# Patient Record
Sex: Male | Born: 2000 | Race: White | Hispanic: No | Marital: Single | Smoking: Never smoker
Health system: Southern US, Community
[De-identification: ages and names within clinical notes are randomized; demographics above are authoritative.]

## PROBLEM LIST (undated history)

## (undated) DIAGNOSIS — R569 Unspecified convulsions: Secondary | ICD-10-CM

## (undated) DIAGNOSIS — F840 Autistic disorder: Secondary | ICD-10-CM

## (undated) HISTORY — DX: Unspecified convulsions: R56.9

## (undated) HISTORY — DX: Autistic disorder: F84.0

---
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
## 2019-01-01 ENCOUNTER — Encounter (INDEPENDENT_AMBULATORY_CARE_PROVIDER_SITE_OTHER): Payer: Self-pay | Admitting: Pediatrics

## 2019-03-01 ENCOUNTER — Ambulatory Visit (INDEPENDENT_AMBULATORY_CARE_PROVIDER_SITE_OTHER): Payer: BLUE CROSS/BLUE SHIELD | Admitting: Pediatrics

## 2019-03-06 ENCOUNTER — Ambulatory Visit (INDEPENDENT_AMBULATORY_CARE_PROVIDER_SITE_OTHER): Payer: BLUE CROSS/BLUE SHIELD | Admitting: Pediatrics

## 2019-03-06 ENCOUNTER — Other Ambulatory Visit: Payer: Self-pay

## 2019-03-06 ENCOUNTER — Encounter (INDEPENDENT_AMBULATORY_CARE_PROVIDER_SITE_OTHER): Payer: Self-pay | Admitting: Pediatrics

## 2019-03-06 DIAGNOSIS — F840 Autistic disorder: Secondary | ICD-10-CM

## 2019-03-06 DIAGNOSIS — G40309 Generalized idiopathic epilepsy and epileptic syndromes, not intractable, without status epilepticus: Secondary | ICD-10-CM

## 2019-03-06 DIAGNOSIS — G40A09 Absence epileptic syndrome, not intractable, without status epilepticus: Secondary | ICD-10-CM

## 2019-03-06 MED ORDER — VALPROATE SODIUM 250 MG/5ML PO SOLN: ORAL | 3 refills | Status: DC

## 2019-03-06 NOTE — Patient Instructions (Signed)
It was good to see you today.  I think that we had a good chat.  This too bad the Andre Holland was not in the mood to cooperate.  I think you are doing a great job.  I am pleased that is he gets older he seems to be able to self regulate more.  I am also pleased that he is not having any seizures.  I sent her prescription for a 90-day refill to [facility] hopefully it all works out.  Let me know if there is a problem.  We will see him in 6 months.

## 2019-03-06 NOTE — Progress Notes (Signed)
This is a Pediatric Specialist E-Visit follow up consult provided via WebEx Clovia Cuff and their parent/guardian Domonic Buchinger consented to an E-Visit consult today.  Location of patient: [place] is with mom Location of provider: Ellison Carwin, MD is in office Patient was referred by Loyal Jacobson, MD   The following participants were involved in this E-Visit: mom, CMA, provider  Chief Complain/ Reason for E-Visit today: Autism/Seizures Total time on call: 15 minutes Follow up: 6 months    Patient: Andre Holland MRN: [id number] Sex: male DOB: [birth year]  Provider: Ellison Carwin, MD Location of Care: [facility]  Note type: Routine return visit  History of Present Illness: Referral Source: Imogene Burn, MD History from: mother, patient and [facility] chart Chief Complaint: Autism/Seizures  ANDRES KAWCZYNSKI is a 18 y.o. male who returns on March 06, 2019 for the first time since August 29, 2018.  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  The patient's last known seizure was in 2016.  He takes and tolerates valproic acid.  He is followed by Dr. Jannifer Franklin who prescribes medications that are psychotropic to help his mood and behavior.  As he has gotten older, the patient has meltdowns that he is able to self regulate both at home and at school.  One of the signs that he is becoming frustrated is that he will repetitively smack his head.  If his attention can be diverted and he can be distracted from what is frustrating him, things seem to resolve fairly well.  He is in the 12th grade at [employer].  Though he is 55, he has another couple of years that he can attend.  In the recent past, he had a dental procedure that helped to restore his dental health.  In general, his health is good.   He sleeps well.  His appetite is large.  He has gained about 5 pounds since he was last seen which is fairly remarkable given that the antiepileptic medicines and neurotrophic medicines are all known to cause increased appetite.  Review of Systems: A complete review of systems was remarkable for mom reports that patient has not had any tonic clonic seizures. She states as far as his absence seizures, she would have to say he has not had any. She states that Dr. Mervyn Skeeters did not change any of the patient's medications due to how well he has been doing. No conerns reported at this time., all other systems reviewed and negative.  Past Medical History History reviewed. No pertinent past medical history. Hospitalizations: No., Head Injury: No., Nervous System Infections: No., Immunizations up to date: Yes.    Copied from prior chart EEG was performed on 09/15/2017 (in the setting of patient being on therapy) which showed no epileptiform activity.  Birth History 5lbs. 0oz. infant born at [redacted]weeks gestational age to a 19year old g 1p 10female. 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 Sees psychiatry and on SSRI and atarax for symptom control, Autism Spectrum Disorder, level  2  Surgical History History reviewed. No pertinent surgical history.  Family History family history is not on file. Family history is negative for migraines, seizures, intellectual disabilities, blindness,  deafness, birth defects, chromosomal disorder, or autism.  Social History  Socioeconomic History  . Marital status: Single  . Years of education:  13 years  . Highest education level:  High school certificate  Occupational History  . Not employed due to cognitive disability  Social Needs  . Financial resource strain: Not on file  . Food insecurity:    Worry: Not on file    Inability: Not on file  . Transportation needs:    Medical: Not on file    Non-medical: Not on file  Tobacco Use  . Smoking status: Never Smoker  . Smokeless tobacco: Never Used  Substance and Sexual Activity  . Alcohol use: Not on file  . Drug use: Not on file  . Sexual activity: Not on file  Social History Narrative     Sheria LangCameron is an 12th grade student.     He attends [employer].   He lives with his mom. He has no siblings.   He enjoys music and watching movies.   No Known Allergies  Physical Exam There were no vitals taken for this visit.  Sheria LangCameron was uncooperative.  He walked into the room sat down waves his arms and walk back out.  He did not follow any commands.  Assessment 1. Epilepsy, generalized convulsive, G40.309. 2. Juvenile absence epilepsy, G40.A09. 3. Autism spectrum disorder with accompanying language impairment and intellectual disability requiring substantial support, F84.0.  Discussion Sheria LangCameron is doing very well both in terms of seizure control and overall his behaviors.  His mother and I talked at length today about long-term care for him.  She is conflicted.  In one way she wants him to be able to live in a setting with other young adults and enjoy their company.  In another she feels that she does not want to hand over his care to strangers.  At present, it is clear to me that she is not ready to even consider an AFL or an assisted living in an adult group.  Plan I refilled prescription for valproic acid for 90 days with 3 refills.  Greater than 50% of a 15  minutes visit was spent in counseling and coordination of care concerning his seizures, his behavior, and long-term care.  He basically refused to be examined and there is no way to compel it given that it was a virtual examination.   Medication List   Accurate as of March 06, 2019  3:44 PM.    citalopram 40 MG tablet Commonly known as:  CELEXA Take by mouth.   guanFACINE 2 MG tablet Commonly known as:  TENEX Take by mouth.   Saphris 5 MG Subl 24 hr tablet Generic drug:  asenapine Place 5 mg under the tongue 2 (two) times daily.   traZODone 100 MG tablet Commonly known as:  DESYREL TAKE 1/2 TO 1 TABLET BY MOUTH AT BEDTIME FOR SLEEP   Valproate Sodium 250 MG/5ML Soln solution Commonly known as:  DEPAKENE 14mls (700mg ) three times daily.    The medication list was reviewed and reconciled. All changes or newly prescribed medications were explained.  A complete medication list was provided to the patient/caregiver.  Deetta PerlaWilliam H Chevonne Bostrom MD

## 2019-08-02 ENCOUNTER — Telehealth (INDEPENDENT_AMBULATORY_CARE_PROVIDER_SITE_OTHER): Payer: Self-pay | Admitting: Pediatrics

## 2019-08-02 NOTE — Telephone Encounter (Signed)
  Who's calling (name and relationship to patient) : Andre Holland, mom  Best contact number: [phone]  Provider they see: Dr. Gaynell Face   Reason for call: Mom states that she need two pieces of paperwork completed by Dr. Gaynell Face. The first is an authorization for the school to administer medication called Depakene during school hours. The second is a pre-surgical physical form for dental surgery scheduled 08/22/19, states she spoke with Dr. Gaynell Face about 7 months ago regarding this, and Dr. Gaynell Face wanted to make sure Andre Holland would be able to take his medication as scheduled prior to his surgery, which would be morning of surgery at 8am, the form consists of a general physical, anything regarding his condition, and medication information. This form allows for Dr. Melanee Left plan of care to be included for Andre Holland and that's why she prefers for Dr. Gaynell Face to complete that form. Mom states she will fax them over to [place] today, so be on the look out. If Dr. Gaynell Face requires mom to get physical done at PCP she will, but as of right now she hasn't planned on doing that.     PRESCRIPTION REFILL ONLY  Name of prescription:  Pharmacy:

## 2019-08-06 NOTE — Telephone Encounter (Signed)
Forms were placed on Dr. Melanee Left desk

## 2019-08-06 NOTE — Telephone Encounter (Signed)
Please contact mother.  We can send the medication form from here but I do not have the surgery form and have not received it.  Telephone message was on September 4.  We have not received anything.

## 2019-08-08 NOTE — Telephone Encounter (Signed)
I completed the forms and they will be faxed.  The problem is that the patient has not had an in office examination within 30 days of this procedure.  I do not know if it will be accepted.  I called mother to inform her of that.

## 2019-09-13 ENCOUNTER — Ambulatory Visit (INDEPENDENT_AMBULATORY_CARE_PROVIDER_SITE_OTHER): Payer: BLUE CROSS/BLUE SHIELD | Admitting: Pediatrics

## 2019-09-13 ENCOUNTER — Other Ambulatory Visit: Payer: Self-pay

## 2019-09-13 ENCOUNTER — Encounter (INDEPENDENT_AMBULATORY_CARE_PROVIDER_SITE_OTHER): Payer: Self-pay | Admitting: Pediatrics

## 2019-09-13 VITALS — BP 130/92 | HR 100 | Ht 75.0 in | Wt 221.0 lb

## 2019-09-13 DIAGNOSIS — G40309 Generalized idiopathic epilepsy and epileptic syndromes, not intractable, without status epilepticus: Secondary | ICD-10-CM

## 2019-09-13 DIAGNOSIS — G40A09 Absence epileptic syndrome, not intractable, without status epilepticus: Secondary | ICD-10-CM

## 2019-09-13 DIAGNOSIS — F840 Autistic disorder: Secondary | ICD-10-CM

## 2019-09-13 MED ORDER — VALPROATE SODIUM 250 MG/5ML PO SOLN: ORAL | 3 refills | Status: DC

## 2019-09-13 NOTE — Patient Instructions (Addendum)
Thank you for coming today.  Glad that he is not having seizures.  He seems physically stable.  In particular I am glad about his sleep.  Not too worried about the brief episodes that you are seeing because they are not prolonged and do not seem to affect his function.  I know that you let me know if I need to see him sooner because he is having seizures.  I refilled his prescription without change.  I would like to see him in 6 months.

## 2019-09-13 NOTE — Progress Notes (Signed)
Patient: Andre Holland MRN: [id number] Sex: male DOB: February 04, 2001  Provider: Ellison Carwin, MD Location of Care: [facility]  Note type: Routine return visit  History of Present Illness: Referral Source: Loyal Jacobson, MD History from: mother, patient and [facility] chart Chief Complaint: Autism/Seizures  Andre Holland is a 17 y.o. male who was evaluated on September 13, 2019, for the first time since March 06, 2019.  The patient has well-controlled epilepsy that is both convulsive and nonconvulsive.  His last known seizure was in 2016.  He takes and tolerates valproic acid.  He has autism spectrum disorder with impairment in language and intellectual ability, which requires substantial support.  He is treated for his behaviors by Dr. Jannifer Franklin with medications that include citalopram, guanfacine, Saphris, and trazodone.  He is in the 12th grade at [employer].  His mother hopes that he will be returning to school on November 4th.  She says that he is doing fairly well with virtual school and actually will pay attention to it.  He had dental surgery for a chipped tooth and was placed under anesthesia where he had deep cleaning of his gums, repair of the tooth, and treatment of his enamel.  This was done at the [facility].  He had no problems.  He goes to bed around 9 p.m., awakens between 9 and 10.  He gets up to eat in the middle of the night and to go to the bathroom.  This is not all that common.  This has been present over the past 6 months.  His mother has seen some fast eyelid blinking, but it is infrequent, does not appear to be associated with altered mental status.  Review of Systems: A complete review of systems was remarkable for mom reports that the patient has been doing well. She states that it is a possibility that the patient has had some episodes but they are hard to catch. She also reports that he has oral surgery on  September 24th. No other concerns reported., all other systems reviewed and negative.  Past Medical History History reviewed. No pertinent past medical history. Hospitalizations: No., Head Injury: No., Nervous System Infections: No., Immunizations up to date: Yes.     Copied from prior chart EEG was performed on 09/15/2017 (in the setting of patient being on therapy) which showed no epileptiform activity.  Birth History 5lbs. 0oz. infant born at [redacted]weeks gestational age to a 19year old g 1p 36female. 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 Did receive diagnosis of TTN Growth and Development wasrecalled asdelayedstarting at age 63-14 months, with regressive of some milestones achieved in the first year (letters, handful of words)  Behavior History Struggles with severe sleep disruptions (sleeping in 1 hour naps), unprompted anger with physical lash-outs starting at age 21. Sees psychiatry and on SSRI and atarax for symptom control, Autism Spectrum Disorder, level 2  Surgical History History reviewed. No pertinent surgical history.  Family History family history is not on file. Family history is negative for migraines, seizures, intellectual disabilities, blindness, deafness, birth defects, chromosomal disorder, or autism.  Social History Socioeconomic History  . Marital status: Single  . Years of education:  99  . Highest education level:  High school certificate  Occupational History  . Not employed  Social Needs  . Financial resource strain: Not on file  . Food insecurity  Worry: Not on file    Inability: Not on file  . Transportation needs    Medical: Not on file    Non-medical: Not on file  Tobacco Use  . Smoking status: Never Smoker  . Smokeless tobacco: Never Used  Substance and  Sexual Activity  . Alcohol use: Not on file  . Drug use: Not on file  . Sexual activity: Not on file  Social History Narrative    Elijahjames is an 12th grade student.    He attends [employer].    He lives with his mom. He has no siblings.    He enjoys music and watching movies.   No Known Allergies  Physical Exam BP (!) 130/92   Pulse 100   Ht 6\' 3"  (1.905 m)   Wt 221 lb (100.2 kg)   BMI 27.62 kg/m   General: alert, well developed, well nourished, in no acute distress, brown hair,hazel eyes, left handed Head: normocephalic, no dysmorphic features Ears, Nose and Throat: Otoscopic: tympanic membranes normal; pharynx: oropharynx is pink without exudates or tonsillar hypertrophy Neck: supple, full range of motion, no cranial or cervical bruits Respiratory: auscultation clear Cardiovascular: no murmurs, pulses are normal Musculoskeletal: no skeletal deformities or apparent scoliosis Skin: no rashes or neurocutaneous lesions  Neurologic Exam  Mental Status: alert; oriented to person; knowledge is normal for age; language is normal Cranial Nerves: visual fields are full to double simultaneous stimuli; extraocular movements are full and conjugate; pupils are round reactive to light; funduscopic examination shows sharp disc margins with normal vessels; symmetric facial strength; midline tongue and uvula; air conduction is greater than bone conduction bilaterally Motor: normal functional strength, tone and mass; clumsy fine motor movements; no pronator drift Sensory: withdrawal x4 Coordination: good finger-to-nose, rapid repetitive alternating movements and finger apposition Gait and Station: slightly broad-based but stable gait and station: patient is able to walk on heels, toes and tandem without difficulty; balance is adequate; Romberg exam is negative; Gower response is negative Reflexes: symmetric and diminished bilaterally; no clonus; bilateral flexor plantar  responses  Assessment 1. Epileptic, generalized, convulsive, G40.309. 2. 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 3. Autism spectrum disorder with accompanying language impairment and intellectual disability requiring substantial support, level 2, F84.0.  Discussion There is no reason to change his valproic acid.    Plan A prescription was issued for that today.  I am pleased that his behavior also is good and that he is sleeping better.  He will return to see me in 6 months' time.  I will see him sooner based on clinical need.  Greater than 50% of a 15-minute visit was spent in counseling and coordination of care.  I also refilled his prescription for valproic acid.   Medication List   Accurate as of September 13, 2019 12:05 PM. If you have any questions, ask your nurse or doctor.    citalopram 40 MG tablet Commonly known as: CELEXA Take by mouth.   fluticasone 50 MCG/ACT nasal spray Commonly known as: FLONASE Place 1 spray into both nostrils daily.   guanFACINE 2 MG tablet Commonly known as: TENEX Take by mouth.   Saphris 5 MG Subl 24 hr tablet Generic drug: asenapine Place 5 mg under the tongue 2 (two) times daily.   traZODone 100 MG tablet Commonly known as: DESYREL TAKE 1/2 TO 1 TABLET BY MOUTH AT BEDTIME FOR SLEEP   Valproate Sodium 250 MG/5ML Soln solution Commonly known as: DEPAKENE 51ms (700mg ) three times daily.  The medication list was reviewed and reconciled. All changes or newly prescribed medications were explained.  A complete medication list was provided to the patient/caregiver.  Jodi Geralds MD

## 2020-02-11 ENCOUNTER — Telehealth (INDEPENDENT_AMBULATORY_CARE_PROVIDER_SITE_OTHER): Payer: Self-pay | Admitting: Pediatrics

## 2020-02-11 DIAGNOSIS — G40A09 Absence epileptic syndrome, not intractable, without status epilepticus: Secondary | ICD-10-CM

## 2020-02-11 DIAGNOSIS — G40309 Generalized idiopathic epilepsy and epileptic syndromes, not intractable, without status epilepticus: Secondary | ICD-10-CM

## 2020-02-11 MED ORDER — VALPROATE SODIUM 250 MG/5ML PO SOLN: ORAL | 3 refills | Status: DC

## 2020-02-11 NOTE — Telephone Encounter (Signed)
Who's calling (name and relationship to patient) : Andre Holland mom   Best contact number: [phone]  Provider they see: Dr. Sharene Skeans   Reason for call: Mom called requesting a refill for the Rx, Volproate Sodium.   Call ID:      PRESCRIPTION REFILL ONLY  Name of prescription:  Pharmacy:

## 2020-02-11 NOTE — Telephone Encounter (Signed)
Rx has been sent to the pharmacy

## 2020-03-11 ENCOUNTER — Ambulatory Visit (INDEPENDENT_AMBULATORY_CARE_PROVIDER_SITE_OTHER): Payer: BC Managed Care – PPO | Admitting: Pediatrics

## 2020-03-11 ENCOUNTER — Encounter (INDEPENDENT_AMBULATORY_CARE_PROVIDER_SITE_OTHER): Payer: Self-pay | Admitting: Pediatrics

## 2020-03-11 ENCOUNTER — Other Ambulatory Visit: Payer: Self-pay

## 2020-03-11 VITALS — BP 98/72 | HR 96 | Ht 75.0 in | Wt 220.0 lb

## 2020-03-11 DIAGNOSIS — G40A09 Absence epileptic syndrome, not intractable, without status epilepticus: Secondary | ICD-10-CM

## 2020-03-11 DIAGNOSIS — G40309 Generalized idiopathic epilepsy and epileptic syndromes, not intractable, without status epilepticus: Secondary | ICD-10-CM | POA: Diagnosis not present

## 2020-03-11 DIAGNOSIS — F840 Autistic disorder: Secondary | ICD-10-CM | POA: Diagnosis not present

## 2020-03-11 DIAGNOSIS — R259 Unspecified abnormal involuntary movements: Secondary | ICD-10-CM | POA: Diagnosis not present

## 2020-03-11 NOTE — Progress Notes (Signed)
Patient: Andre Holland MRN: [id number] Sex: male DOB: 2001/08/26  Provider: Ellison Carwin, MD Location of Care: [facility] Child Neurology  Note type: Routine return visit  History of Present Illness: Referral Source: Loyal Jacobson, MD History from: mother, patient and [facility] chart Chief Complaint: Autism/Seizures  Andre Holland is a 19 y.o. male who was evaluated March 11, 2020 for the first time since September 13, 2019.  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  He is followed by Dr. Jannifer Franklin for behaviors related to his autism and attention span problems.  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  He takes and tolerates valproic acid.  In the past he has had episodes of eyelid blinking.  I believe that he is showing me manifestations of a tic disorder.  He is return to [employer] school 5 days a week.  He enjoys being at school his attitude is better.  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  His mother takes and picks him up.  She uses the [employer] office for after school and summer school.  When he gets older, I think that he may attend therapy on the day.  He has 2 more years at [employer].  His weight his stabilized.  This is a combination of careful management of what he eats but also increasing his physical activity.  He walks on a treadmill in the house and is beginning to walk outside now that the weather has improved.  He goes to bed between 9 and 10 PM falls asleep quickly and sleeps soundly until 6:45 AM on school days and 8 AM to 9 AM on weekends.  Review of Systems: A complete review of systems was remarkable for patient is here to be seen for autism and seizures. Mother states that the last month has been problematic to say the least. She  states that the patient has started having pronounced head jerking. She also states that the patient twists his arms out. She is not sure if these are seizures. No other concerns at this time., all other systems reviewed and negative.  Past Medical History History reviewed. No pertinent past medical history. Hospitalizations: No., Head Injury: No., Nervous System Infections: No., Immunizations up to date: Yes.    Copied from prior chart EEG was performed on 09/15/2017 (in the setting of patient being on therapy) which showed no epileptiform activity.  Birth History 5lbs. 0oz. infant born at [redacted]weeks gestational age to a 20year old g 1p 63female. 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 Sees psychiatry and on SSRI and atarax for symptom control, Autism Spectrum Disorder, level 2  Surgical History History reviewed. No pertinent surgical history.  Family History family history is not on file. Family history is negative for migraines, seizures, intellectual disabilities, blindness, deafness, birth defects, chromosomal disorder, or autism.  Social History Socioeconomic History  . Marital status: Single  . Years of education:  46  .  Highest education level:  High school certificate  Occupational History  . Not employed  Tobacco Use  . Smoking status: Never Smoker  . Smokeless tobacco: Never Used  Substance and Sexual Activity  . Alcohol use: Not on file  . Drug use:  Not on file  . Sexual activity: Not on file  Social History Narrative    Andre Holland is an 12th grade student.    He attends [employer].    He lives with his mom. He has no siblings.    He enjoys music and watching movies.   No Known Allergies  Physical Exam BP 98/72   Pulse 96   Ht 6\' 3"  (1.905 m)   Wt 220 lb (99.8 kg)   BMI 27.50 kg/m   General: alert, well developed, well nourished, in no acute distress, brown hair, hazel eyes, left handed Head: normocephalic, no dysmorphic features Ears, Nose and Throat: Otoscopic: tympanic membranes normal; pharynx: oropharynx is pink without exudates or tonsillar hypertrophy Neck: supple, full range of motion, no cranial or cervical bruits Respiratory: auscultation clear Cardiovascular: no murmurs, pulses are normal Musculoskeletal: no skeletal deformities or apparent scoliosis Skin: no rashes or neurocutaneous lesions  Neurologic Exam  Mental Status: alert; oriented to person; intermittent eye contact; knowledge is below normal for age; language is limited but he is able to name objects and follow commands, he speaks spontaneously very little Cranial Nerves: visual fields are full to double simultaneous stimuli; extraocular movements are full and conjugate; pupils are round reactive to light; funduscopic examination shows bilateral positive red reflex; symmetric facial strength; midline tongue and uvula; he localizes sound bilaterally; no facial movements or sounds Motor: normal functional strength, tone and mass; good fine motor movements; no pronator drift; no motor tics Sensory: withdraws x4 to noxious stimuli Coordination: good finger-to-nose, rapid repetitive alternating movements and finger apposition are mildly clumsy Gait and Station: slightly broad-based but stable gait and station; balance is adequate; Romberg exam is negative;  Reflexes: symmetric and diminished bilaterally; no clonus; bilateral flexor plantar  responses  Assessment 1.  Autism spectrum disorder with accompanying language impairment and intellectual disability, requiring substantial support, level 2, F84.0. 2.  Generalized convulsive epilepsy, G40.309. 3.  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 4.  Abnormal involuntary movements, R25.9.  Discussion In my opinion the abnormal involuntary movements represent motor tics there is some choreiform movement to the activity.  There is also been some chewing activities that suggest the possibility of tar dive dyskinesia.  I do not think that Saphris typically causes this.  I asked mother to discuss this with Dr. Darleene Cleaver.  I also asked her to make videos of the behavior so that I can see it.  Plan He will return to see me in 6 months.  I refilled his prescription for valproic acid.  Greater than 50% of a 25-minute visit was spent in counseling and coordination of care concerning his seizures, the involuntary movements and his autism.   Medication List   Accurate as of March 11, 2020 10:07 AM. If you have any questions, ask your nurse or doctor.    citalopram 40 MG tablet Commonly known as: CELEXA Take by mouth.   fluticasone 50 MCG/ACT nasal spray Commonly known as: FLONASE Place 1 spray into both nostrils daily.   guanFACINE 2 MG tablet Commonly known as: TENEX Take by mouth.   Saphris 5 MG Subl 24 hr tablet Generic drug: asenapine Place 5 mg under the tongue 2 (two) times daily.  traZODone 100 MG tablet Commonly known as: DESYREL TAKE 1/2 TO 1 TABLET BY MOUTH AT BEDTIME FOR SLEEP   Valproate Sodium 250 MG/5ML Soln solution Commonly known as: DEPAKENE (700mg ) three times daily.    The medication list was reviewed and reconciled. All changes or newly prescribed medications were explained.  A complete medication list was provided to the patient/caregiver.  MD

## 2020-03-11 NOTE — Patient Instructions (Signed)
I am glad that Andre Holland is doing so well.  I believe that these episodes are tic-like movements.  I do not know why they have begun.  They also could represent stereotypies.  Using medication to try to bring them under control is not a good idea because he is on so many medicines.  I do not think that Saphris is responsible for this but you should check with Dr. Jannifer Franklin.  I would like to see him again in 6 months.  His prescription for valproic acid has been refilled for a year.  Thanks for coming.

## 2020-03-13 ENCOUNTER — Ambulatory Visit (INDEPENDENT_AMBULATORY_CARE_PROVIDER_SITE_OTHER): Payer: BLUE CROSS/BLUE SHIELD | Admitting: Pediatrics

## 2020-07-23 ENCOUNTER — Telehealth (INDEPENDENT_AMBULATORY_CARE_PROVIDER_SITE_OTHER): Payer: Self-pay | Admitting: Pediatrics

## 2020-07-24 ENCOUNTER — Telehealth (INDEPENDENT_AMBULATORY_CARE_PROVIDER_SITE_OTHER): Payer: Self-pay | Admitting: Pediatrics

## 2020-07-24 NOTE — Telephone Encounter (Signed)
  Who's calling (name and relationship to patient) :Natalia Leatherwood ( mom)  Best contact number:[phone]  Provider they see: dr. Sharene Skeans  Reason for call: Dental paperwork up front for patient dental surgery. Its in Dr. Sharene Skeans box     PRESCRIPTION REFILL ONLY  Name of prescription:  Pharmacy:

## 2020-07-24 NOTE — Telephone Encounter (Signed)
Completed and returned for disposition.

## 2020-09-09 NOTE — Telephone Encounter (Signed)
m °

## 2020-09-11 ENCOUNTER — Ambulatory Visit (INDEPENDENT_AMBULATORY_CARE_PROVIDER_SITE_OTHER): Payer: BC Managed Care – PPO | Admitting: Pediatrics

## 2020-09-16 ENCOUNTER — Other Ambulatory Visit: Payer: Self-pay

## 2020-09-16 ENCOUNTER — Ambulatory Visit (INDEPENDENT_AMBULATORY_CARE_PROVIDER_SITE_OTHER): Payer: BC Managed Care – PPO | Admitting: Pediatrics

## 2020-09-16 ENCOUNTER — Encounter (INDEPENDENT_AMBULATORY_CARE_PROVIDER_SITE_OTHER): Payer: Self-pay | Admitting: Pediatrics

## 2020-09-16 VITALS — BP 120/90 | HR 92 | Ht 75.0 in | Wt 196.4 lb

## 2020-09-16 DIAGNOSIS — F840 Autistic disorder: Secondary | ICD-10-CM

## 2020-09-16 DIAGNOSIS — G4700 Insomnia, unspecified: Secondary | ICD-10-CM | POA: Diagnosis not present

## 2020-09-16 DIAGNOSIS — G40A09 Absence epileptic syndrome, not intractable, without status epilepticus: Secondary | ICD-10-CM

## 2020-09-16 DIAGNOSIS — G40309 Generalized idiopathic epilepsy and epileptic syndromes, not intractable, without status epilepticus: Secondary | ICD-10-CM | POA: Diagnosis not present

## 2020-09-16 DIAGNOSIS — G251 Drug-induced tremor: Secondary | ICD-10-CM | POA: Insufficient documentation

## 2020-09-16 MED ORDER — VALPROATE SODIUM 250 MG/5ML PO SOLN: ORAL | 3 refills | Status: DC

## 2020-09-16 NOTE — Progress Notes (Signed)
Patient: Andre Holland MRN: [id number] Sex: male DOB: Feb 28, 2001  Provider: Ellison Carwin, MD Location of Care: [facility] Child Neurology  Note type: Routine return visit  History of Present Illness: Referral Source: Loyal Jacobson, MD History from: mother, patient and [facility] chart Chief Complaint: Autism/Seizures  Andre Holland is a 19 y.o. male who was evaluated September 16, 2020 for the 1st time since March 11, 2020.  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  He is followed by Dr. Jannifer Franklin for behaviors related to his autism and attention span.  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  He takes and tolerates valproic acid.  He had an EEG on him 2018 it was normal.  We decided at that time not to take him off medication.  Mother would now like to see if she can wean him.  I recommended that we not do it during the school year.  He had significant problems with the movement disorder when I saw him last he experienced pronounced head jerking interesting his arms out.  He also experienced some chewing movements.  This raises the possibility of tardive dyskinesia.  He was taken off of Saphris and these behaviors disappeared.  At that time his father became more aware of tremor which I think is related to valproic acid.  Fortunately the tremor is intermittent and is not disabling.  He had an extraction of all 4 wisdom teeth a month ago and got through it.  He was on basically liquid and pured diet for some time until he his gums healed.  He attends [employer] 5 days a week.  His mother takes and takes him up from school.  She uses the autism Society afterschool program during the school year and during the summer.  It is likely that he will be there days once he finishes high school in 2 years.  His weight has dropped significantly from 220 down to 196.  He is tall.   He goes to bed between 8 and 10 PM and falls asleep soundly with the help of guanfacine and trazodone.  His mother has to awaken him at 6:45 AM on school days.  His health is good.  He has not contracted Covid.  Paternal grandfather did.  Mother and son are vaccinated.  Mother works from home.  During good weather, they go out walking as a way to gain physical exercise.  Review of Systems: A complete review of systems was remarkable for patient is here to be seen for autism and seizures.Mom states that the patient has not had any seizures since his last visit.She states that she is has concerns at the patient's hands shaking at times. She reports no other concerns., all other systems reviewed and negative.  Past Medical History History reviewed. No pertinent past medical history. Hospitalizations: No., Head Injury: No., Nervous System Infections: No., Immunizations up to date: Yes.    Copied from prior chart notes EEG was performed on 09/15/2017 (in the setting of patient being on therapy) which showed no epileptiform activity.  Birth History 5lbs. 0oz. infant born at [redacted]weeks gestational age to a 20year old g 1p 57female. 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 No pertinent surgical history.  Family History family history is not on file. Family history is negative for migraines, seizures, intellectual disabilities, blindness, deafness, birth defects, chromosomal disorder, or  autism.  Social History Socioeconomic History  . Marital status: Single  . Years of education:  100  . Highest education level:  Extended high school  Occupational History  . Not employed  Tobacco Use  . Smoking status: Never Smoker  . Smokeless tobacco: Never Used  Substance and Sexual Activity  . Alcohol use: Not on file  . Drug use: Not on file  . Sexual activity: Not on file  Social History Narrative    Laken is an 12th grade student.    He attends [employer].    He lives with his mom. He has no siblings.    He enjoys music and watching movies.   No Known Allergies  Physical Exam BP 120/90   Pulse 92   Ht 6\' 3"  (1.905 m)   Wt 196 lb 6.4 oz (89.1 kg)   BMI 24.55 kg/m   General: alert, well developed, well nourished, in no acute distress, brown hair, blue eyes, right handed Head: normocephalic, no dysmorphic features Ears, Nose and Throat: Otoscopic: tympanic membranes normal; pharynx: oropharynx is pink without exudates or tonsillar hypertrophy Neck: supple, full range of motion, no cranial or cervical bruits Respiratory: auscultation clear Cardiovascular: no murmurs, pulses are normal Musculoskeletal: no skeletal deformities or apparent scoliosis Skin: no rashes or neurocutaneous lesions  Neurologic Exam  Mental Status: alert; oriented to person; knowledge is below normal for age; language is limited, but he is able to follow commands much of the time Cranial Nerves: visual fields are full to double simultaneous stimuli; extraocular movements are full and conjugate; pupils are round, reactive to light; funduscopic examination shows sharp disc margins with normal vessels; symmetric, impassive facial strength; midline tongue and uvula; air conduction is greater than bone conduction bilaterally Motor: normal strength, tone and mass; good fine motor movements Sensory: withdraws x4 Coordination: good finger-to-nose, no tremor Gait and Station:  slightly broad-based but stable gait and station Reflexes: symmetric and diminished bilaterally; no clonus; bilateral flexor plantar responses  Assessment 1.  Autism spectrum disorder with accompanying language impairment and intellectual disability, requiring substantial support, level 2, F84.0. 2.  Generalized convulsive epilepsy, G40.309.  3.  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 4.  Insomnia, unspecified, G47.00. 5.  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  Discussion I am pleased that Diandre is doing so well.  I am pleased that he is lost so much weight.  He looks well.  After discussion with his mother we decided that we will attempt to taper and discontinue his valproic acid in June when he is out of school.  We could see recurrence of seizures and also worsening of his behavior.  Either of those would convince me to restart the medication.  Plan A prescription was issued for valproic acid 90-day refill with 3 refills.  I will see him in June 2022.  At that time we will attempt to taper and discontinue his medication.  I informed his mother that I will retire as of September 2022 and that if he needs to be seen by neurologist, we will see him in this office.  Greater than 50% of the 30-minute visit was spent in counseling and coordination of  care concerning his autism, seizures, insomnia, and drug-induced tremor.   Medication List   Accurate as of September 16, 2020 11:03 AM. If you have any questions, ask your nurse or doctor.      TAKE these medications   citalopram 40 MG tablet Commonly known as: CELEXA Take by mouth.   fluticasone 50 MCG/ACT nasal spray Commonly known as: FLONASE Place 1 spray into both nostrils daily.   guanFACINE 2 MG tablet Commonly known as: TENEX Take by mouth.   traZODone 100 MG tablet Commonly known as: DESYREL TAKE 1/2 TO 1 TABLET BY MOUTH AT BEDTIME FOR SLEEP   Valproate Sodium 250 MG/5ML Soln solution Commonly known as: DEPAKENE (700mg )  three times daily.    The medication list was reviewed and reconciled. All changes or newly prescribed medications were explained.  A complete medication list was provided to the patient/caregiver.  MD

## 2020-09-16 NOTE — Patient Instructions (Signed)
Thank you for coming today.  I am pleased that Andre Holland is not experiencing any seizures.  The tremors that he has are related to the effects of valproic acid.  Please that the abnormal movements that he had went away when Saphris was discontinued.  I am also pleased that he went through extraction of all 4 wisdom teeth without significant problems.  Reporting EEG back in 2018 which was negative.  I do not see a reason to repeat it.  In general when I see you, we will make plans to slowly taper and discontinue he is valproic acid.  We could see recurrence of seizures we also could see emergence of problems with behavior.  If that happens we will have to restart it.  I told her that I will be retiring September 2022.  We will find a provider in our practice to follow him.  With his autism spectrum disorder, I do not think that he can be competently seen by an adult neurologist.  We will talk about that when I see you in June 2022.  There is anything I can do between now and then, please contact me.

## 2020-12-12 ENCOUNTER — Other Ambulatory Visit (INDEPENDENT_AMBULATORY_CARE_PROVIDER_SITE_OTHER): Payer: Self-pay | Admitting: Pediatrics

## 2020-12-12 DIAGNOSIS — G40309 Generalized idiopathic epilepsy and epileptic syndromes, not intractable, without status epilepticus: Secondary | ICD-10-CM

## 2020-12-12 DIAGNOSIS — G40A09 Absence epileptic syndrome, not intractable, without status epilepticus: Secondary | ICD-10-CM

## 2021-04-01 ENCOUNTER — Encounter (INDEPENDENT_AMBULATORY_CARE_PROVIDER_SITE_OTHER): Payer: Self-pay

## 2021-05-17 ENCOUNTER — Ambulatory Visit (INDEPENDENT_AMBULATORY_CARE_PROVIDER_SITE_OTHER): Payer: BC Managed Care – PPO | Admitting: Pediatrics

## 2021-05-17 ENCOUNTER — Other Ambulatory Visit: Payer: Self-pay

## 2021-05-17 ENCOUNTER — Encounter (INDEPENDENT_AMBULATORY_CARE_PROVIDER_SITE_OTHER): Payer: Self-pay | Admitting: Pediatrics

## 2021-05-17 VITALS — BP 120/72 | HR 76 | Ht 74.5 in | Wt 187.8 lb

## 2021-05-17 DIAGNOSIS — G40A09 Absence epileptic syndrome, not intractable, without status epilepticus: Secondary | ICD-10-CM | POA: Diagnosis not present

## 2021-05-17 DIAGNOSIS — G40309 Generalized idiopathic epilepsy and epileptic syndromes, not intractable, without status epilepticus: Secondary | ICD-10-CM | POA: Diagnosis not present

## 2021-05-17 MED ORDER — VALPROIC ACID 250 MG/5ML PO SOLN: ORAL | 5 refills | Status: DC

## 2021-05-17 NOTE — Progress Notes (Signed)
Patient: Andre Holland MRN: [id number] Sex: male DOB: 10-13-2001  Provider: Ellison Carwin, MD Location of Care: [facility]  Note type: Routine return visit  History of Present Illness: Referral Source: Andre Jacobson, MD History from: Andre [facility] chart and mom Chief Complaint: seizures  Andre Holland is a 20 y.o. male who was evaluated May 17, 2021 for the first time since September 16, 2020.  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  He is followed by Andre Holland for behaviors related to his autism and attention span.  He is followed in our office to treat seizures that have been well controlled.  In the past semiology of his seizures has included both convulsive and nonconvulsive episodes.  He had an EEG in 2018 that was normal.  We did not take him off of medication at that time.  His mother would like to take him off now.  I would like to perform an EEG if possible but if not, would be willing to slowly taper and discontinue his medication watching out for recurrent seizures.  He continues to have tremors of his arms.  I did not see any jerking of his head or extension of his arms.  His tremor today was minimal.  He was not very cooperative he came in with a hood over his head and resisted any attempts to try to expose his face to examine him.  He follows limited commands.  He is continue to lose weight and has lost another 8.2 pounds.  He goes to bed somewhere between 8 and 10 PM and sleeps until 10 AM.  During the school year he attends [facility] 5 days a week and will be able to do so for 1 more year.  His mother has used the [employer] afterschool program during the school year and during the summer.  In general his health is good.  He has not contracted COVID paternal grandfather did both he and his mother are vaccinated and has had boosters.  She works from home.  Review of Systems: A complete review  of systems was assessed and was negative.  Past Medical History History reviewed. No pertinent past medical history. Hospitalizations: No., Head Injury: No., Nervous System Infections: No., Immunizations up to date: Yes.    Copied from prior chart notes EEG was performed on 09/15/2017 (in the setting of patient being on therapy) which showed no epileptiform activity.    Birth History 5 lbs. 0 oz. infant born at [redacted] weeks gestational age to a 21 year old g 1 p 1 male. 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 No pertinent surgical history.  Family History family history is not on file. Family history is negative for migraines, seizures, intellectual disabilities, blindness, deafness, birth defects, chromosomal disorder, or autism.  Social History Socioeconomic History   Marital status: Single   Years of education: 13+   Highest education level: In high school for 1 more year  Occupational History   Not employed  Tobacco Use  Smoking status: Never   Smokeless tobacco: Never  Substance and Sexual Activity   Alcohol use: Not on file   Drug use: Not on file   Sexual activity: Not on file  Social History Narrative   Andre Holland is an 12th grade student.   He attends [employer].   He lives with his mom. He has no siblings.   He enjoys music and watching movies.   No Known Allergies  Physical Exam BP 120/72   Pulse 76   Ht 6' 2.5" (1.892 m)   Wt 187 lb 12.8  oz (85.2 kg)   BMI 23.79 kg/m   General: alert, well developed, well nourished, in no acute distress, brown hair, blue eyes, right handed Head: normocephalic, no dysmorphic features Ears, Nose and Throat: Otoscopic: tympanic membranes occluded with wax; would not open his mouth Neck: supple, full range of motion, no cranial or cervical bruits Respiratory: auscultation clear Cardiovascular: no murmurs, pulses are normal Musculoskeletal: no skeletal deformities or apparent scoliosis Skin: no rashes or neurocutaneous lesions  Neurologic Exam  Mental Status: alert; oriented to person; knowledge is below normal for age; language is limited, but he was able to follow commands when he chose to do so Cranial Nerves: visual fields are full to double simultaneous stimuli; extraocular movements are full and conjugate; pupils are round reactive to light; significant photophobia limited the funduscopic exam; symmetric, impassive facial strength; midline tongue; localizes sound ilaterally Motor: normal functional strength, tone and mass; clumsy fine motor movements; unable to test pronator drift Sensory: withdrawal x4 Coordination: unable to test Gait and Station: slightly broad-based but steady Reflexes: symmetric and diminished bilaterally; no clonus; bilateral flexor plantar responses   Assessment 1.  Autism spectrum disorder with accompanying language impairment and intellectual disability, requiring substantial support, level 2, F84.0. 2.  Generalized convulsive epilepsy, G40.309. 3.  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 4.  Insomnia, unspecified, G47.00. 5.  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  Discussion Re: to try to perform an EEG to see if we can gain any insight into his seizures.  If he is not able to complete it, I would be willing to slowly taper or discontinue his medication over period of 2 to 3 months.  Plan I ordered an EEG.  We will make no changes in his valproic acid for now.   A prescription was written for a years worth of medication.  Greater than 50% of a 30-minute visit was spent in counseling and coordination of care concerning his seizures, autism, insomnia, tremor, and discussing transition of care.  He will be followed by one of my partners, probably Dr. Lorenz Coaster.  I will be in contact with the family after an attempt is made to do the EEG.  I intend to taper his medication over the summer unless he has recurrent seizures.   Medication List    Accurate as of May 17, 2021 10:10 AM. If you have any questions, ask your nurse or doctor.     citalopram 40 MG tablet Commonly known as: CELEXA Take by mouth.   fluticasone 50 MCG/ACT nasal spray Commonly known as: FLONASE Place 1 spray into both nostrils daily.   guanFACINE 2 MG tablet Commonly known as: TENEX Take by mouth.   MULTIVITAMIN ADULT PO Take by mouth.   traZODone 100 MG tablet Commonly known as: DESYREL TAKE 1/2 TO 1 TABLET BY MOUTH AT BEDTIME FOR SLEEP   valproic acid 250 MG/5ML solution Commonly known as: DEPAKENE TAKE 14 MLS (700 MG)  THREE TIMES A DAY     The medication list was reviewed and reconciled. All changes or newly prescribed medications were explained.  A complete medication list was provided to the patient/caregiver.  Deetta Perla MD

## 2021-05-17 NOTE — Patient Instructions (Signed)
Thank you for coming today.  We are going to try to perform an EEG to see if there has been any change in it since 2018.  I do not know if this will be successful.  If not, we will try to slowly taper and discontinue his valproic acid over period of about 6 weeks.  It was successfully get him off medication without having recurrent seizures, then he may continue to follow with Dr. Jannifer Franklin.  If he needs ongoing neurologic care that his seizures are in good control, then I would recommend Dr. Lorenz Coaster.  If his seizures are not in good control he will be seeing Dr. Lezlie Lye.  At [facility], we are committed to providing exceptional care. You will receive a patient satisfaction survey through text or email regarding your visit today. Your opinion is important to me. Comments are appreciated.

## 2021-06-26 ENCOUNTER — Other Ambulatory Visit: Payer: Self-pay

## 2021-06-26 ENCOUNTER — Emergency Department (HOSPITAL_COMMUNITY)
Admission: EM | Admit: 2021-06-26 | Discharge: 2021-06-26 | Disposition: A | Discharge status: Home / Self Care | Payer: BC Managed Care – PPO | Attending: Emergency Medicine | Admitting: Emergency Medicine

## 2021-06-26 ENCOUNTER — Emergency Department (HOSPITAL_COMMUNITY): Payer: BC Managed Care – PPO

## 2021-06-26 DIAGNOSIS — G40909 Epilepsy, unspecified, not intractable, without status epilepticus: Secondary | ICD-10-CM | POA: Diagnosis not present

## 2021-06-26 DIAGNOSIS — X58XXXA Exposure to other specified factors, initial encounter: Secondary | ICD-10-CM | POA: Insufficient documentation

## 2021-06-26 DIAGNOSIS — W19XXXA Unspecified fall, initial encounter: Secondary | ICD-10-CM

## 2021-06-26 DIAGNOSIS — S0990XA Unspecified injury of head, initial encounter: Secondary | ICD-10-CM

## 2021-06-26 DIAGNOSIS — S0081XA Abrasion of other part of head, initial encounter: Secondary | ICD-10-CM | POA: Insufficient documentation

## 2021-06-26 DIAGNOSIS — S0993XA Unspecified injury of face, initial encounter: Secondary | ICD-10-CM | POA: Diagnosis present

## 2021-06-26 DIAGNOSIS — F840 Autistic disorder: Secondary | ICD-10-CM | POA: Insufficient documentation

## 2021-06-26 LAB — CBC
HCT: 51.4 % (ref 39.0–52.0)
Hemoglobin: 17.8 g/dL — ABNORMAL HIGH (ref 13.0–17.0)
MCH: 30.8 pg (ref 26.0–34.0)
MCHC: 34.6 g/dL (ref 30.0–36.0)
MCV: 88.9 fL (ref 80.0–100.0)
Platelets: 199 10*3/uL (ref 150–400)
RBC: 5.78 MIL/uL (ref 4.22–5.81)
RDW: 13.3 % (ref 11.5–15.5)
WBC: 18.1 10*3/uL — ABNORMAL HIGH (ref 4.0–10.5)
nRBC: 0 % (ref 0.0–0.2)

## 2021-06-26 MED ORDER — ACETAMINOPHEN 500 MG PO TABS
1000.0000 mg | ORAL_TABLET | Freq: Once | ORAL | Status: AC
  Administered 2021-06-26: 1000 mg via ORAL
  Filled 2021-06-26: qty 2

## 2021-06-26 NOTE — ED Triage Notes (Signed)
Pt BIB [facility] from home, mother found pt unresponsive with head on the toilet like he had been vomiting and abrasions to his face this am. Pt has a hx of seizures, has not had seizures in 10 years or more. Pt is autistic, the neurologist wanted to set pt up for an EEG to see if he needed to continue taking his prescribed Depakote.  Pt given 5mg  Versed IM en route (Right shoulder)   BP 140/82 CBG 203 98% RA  HR 98

## 2021-06-26 NOTE — ED Notes (Signed)
E-signature pad unavailable at time of pt discharge. This RN discussed discharge materials with pt and answered all pt questions. Pt stated understanding of discharge material. ? ?

## 2021-06-26 NOTE — Discharge Instructions (Addendum)
Follow-up with his neurologist and regular doctor as needed.  Head CT here tonight without any acute findings or any evidence of any injury or skull fracture or facial fractures.  As we discussed there was a mixup on the labs.  White blood cell count is elevated at 18,000 which can go along with a seizure.  But also may have had a fall.  Return for any new or worse symptoms.

## 2021-06-26 NOTE — ED Provider Notes (Signed)
Patient eating and acting very normal according to mother.  Head CT negative.  CBC was elevated at 18,000.  But mother feels that patient is acting normal.  There was a mixup on his labs.  Patient never got his basic metabolic panel never got his magnesium that I had added on at about 430 this afternoon.  Initial blood work was clotted.  Then it must of gotten canceled.  Explained to mother.  She is okay foregoing the labs for now we will follow-up with his doctors.  Will return for any new or worse symptoms   Vanetta Mulders, MD 06/26/21 2019

## 2021-06-26 NOTE — ED Notes (Signed)
Pt and family refused vital signs for discharge

## 2021-06-26 NOTE — ED Provider Notes (Signed)
[facility] EMERGENCY DEPARTMENT Provider Note   CSN: [id number] Arrival date & time: 06/26/21  1112     History Chief Complaint  Patient presents with   Seizures    Andre Holland is a 20 y.o. male.  Patient with hx autism, 'convulsive and non-convulsive epilepsy', presents s/p suspected seizure. Was in bathroom, initially parent thought may have nausea/vomiting, but then concern for possible seizure. EMS was called. No seizure witness by EMS, and by EMS arrival pts mental state and function appeared to be consistent with baseline. By report, no recent seizures. No change in meds. Pt unable to provide additional hx - level 5 caveat. No report of fevers. Abrasions noted to face/nose - tetanus is up to date.   The history is provided by the patient, the EMS personnel, a parent and medical records. The history is limited by the condition of the patient.  Seizures     No past medical history on file.  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     No family history on file.  Social History   Tobacco Use   Smoking status: Never   Smokeless tobacco: Never    Home Medications Prior to Admission medications   Medication Sig Start Date End Date Taking? Authorizing Provider  citalopram (CELEXA) 40 MG tablet Take by mouth.    [provider]  fluticasone (FLONASE) 50 MCG/ACT nasal spray Place 1 spray into both nostrils daily.    [provider]  guanFACINE (TENEX) 2 MG tablet Take by mouth. 06/22/16   [provider]  Multiple  Vitamin (MULTIVITAMIN ADULT PO) Take by mouth.    [provider]  traZODone (DESYREL) 100 MG tablet TAKE 1/2 TO 1 TABLET BY MOUTH AT BEDTIME FOR SLEEP 06/20/16   [provider]  valproic acid (DEPAKENE) 250 MG/5ML solution TAKE 14 MLS (700 MG)  THREE TIMES A DAY 05/17/21   Deetta Perla, MD    Allergies    Patient has no known allergies.  Review of Systems   Review of Systems  Unable to perform ROS: Patient nonverbal  Neurological:  Positive for seizures.  Level 5 caveat, severe autism, not verbally responsive   Physical Exam Updated Vital Signs BP 140/82   Pulse (!) 104   Temp 97.7 F (36.5 C) (Oral)   Resp (!) 21   SpO2 95%   Physical Exam Vitals and nursing note reviewed.  Constitutional:      Appearance: Normal appearance. He is well-developed.  HENT:     Head:     Comments: Superficial abrasion to face, cheek, nose. Facial bones/orbits appear grossly intact.     Nose:     Comments: No nasal septal hematoma or bleeding.     Mouth/Throat:     Mouth: Mucous membranes are moist.     Pharynx: Oropharynx is clear.  Eyes:     General: No scleral icterus.    Conjunctiva/sclera: Conjunctivae normal.     Pupils: Pupils are equal, round, and reactive to light.  Neck:     Vascular: No carotid bruit.     Trachea: No tracheal deviation.  Comments: No stiffness or rigidity.  Cardiovascular:     Rate and Rhythm: Normal rate and regular rhythm.     Pulses: Normal pulses.     Heart sounds: Normal heart sounds. No murmur heard.   No friction rub. No gallop.  Pulmonary:     Effort: Pulmonary effort is normal. No accessory muscle usage or respiratory distress.     Breath sounds: Normal breath sounds.  Abdominal:     General: Bowel sounds are normal. There is no distension.     Palpations: Abdomen is soft.     Tenderness: There is no abdominal tenderness.  Genitourinary:    Comments: No cva tenderness. Musculoskeletal:        General: No swelling  or tenderness.     Cervical back: Normal range of motion and neck supple. No rigidity.     Comments: CTLS spine, non tender, aligned, no step off. Is moving bilateral extremities purposefully - no focal pain, sts or tenderness noted.   Skin:    General: Skin is warm and dry.     Findings: No rash.  Neurological:     Mental Status: He is alert.     Comments: Awake and alert. Moving bilateral extremities purposefully with good strength. Pts mental status and function reported by EMS as being c/w his baseline.   Psychiatric:     Comments: Alert, content.     ED Results / Procedures / Treatments   Labs (all labs ordered are listed, but only abnormal results are displayed) Labs Reviewed  CBC  COMPREHENSIVE METABOLIC PANEL  VALPROIC ACID LEVEL    EKG None  Radiology No results found.  Procedures Procedures   Medications Ordered in ED Medications - No data to display  ED Course  I have reviewed the triage vital signs and the nursing notes.  Pertinent labs & imaging results that were available during my care of the patient were reviewed by me and considered in my medical decision making (see chart for details).    MDM Rules/Calculators/A&P                           Continuous pulse ox and cardiac monitoring. Seizure precautions. Labs sent.   Reviewed nursing notes and prior charts for additional history.   Parent now present - indicates she heart patient fall in bathroom, ran to him, denies seeing seizure activity, no loc noted. She indicates he was lying on floor, between toilet and sink w contusion/abrasion to face. Since then she reports acting at baseline. Parent expresses interest in imaging head.  Labs/imaging pending - signed out ot Dr Deretha Emory - anticipate in no seizures and workup unremarkable, d/c to home then, w outpatient neurology f/u.        Final Clinical Impression(s) / ED Diagnoses Final diagnoses:  None    Rx / DC Orders ED Discharge Orders      None        Cathren Laine, MD 06/26/21 1545

## 2021-07-07 ENCOUNTER — Telehealth (INDEPENDENT_AMBULATORY_CARE_PROVIDER_SITE_OTHER): Payer: Self-pay | Admitting: Pediatrics

## 2021-07-07 DIAGNOSIS — R259 Unspecified abnormal involuntary movements: Secondary | ICD-10-CM

## 2021-07-07 DIAGNOSIS — G40309 Generalized idiopathic epilepsy and epileptic syndromes, not intractable, without status epilepticus: Secondary | ICD-10-CM

## 2021-07-07 DIAGNOSIS — F840 Autistic disorder: Secondary | ICD-10-CM

## 2021-07-07 DIAGNOSIS — G40A09 Absence epileptic syndrome, not intractable, without status epilepticus: Secondary | ICD-10-CM

## 2021-07-07 NOTE — Telephone Encounter (Signed)
Who's calling (name and relationship to patient) : dailey buccheri mom  Best contact number: [phone]  Provider they see: Dr. Artis Flock  Reason for call: Needs help scheduling EEG where they were referral for EEG type and help getting med auth completed  Call ID:      PRESCRIPTION REFILL ONLY  Name of prescription:  Pharmacy:

## 2021-07-08 NOTE — Telephone Encounter (Signed)
The EEG has been ordered at [facility]. He will need extra time because of his intellectual disability.  I completed a school medication form for him. Please let Mom know.  Thanks, Inetta Fermo

## 2021-07-09 NOTE — Telephone Encounter (Signed)
Contacted mom and let her know the EEG was ordered to be preformed at [facility]. Attempted to contact EEG to get this scheduled, but was unable to reach someone. Will call her back on Monday and get that scheduled  Also let mom know that the form was up front for her to pick up at her earliest convenience.

## 2021-07-26 ENCOUNTER — Telehealth (INDEPENDENT_AMBULATORY_CARE_PROVIDER_SITE_OTHER): Payer: Self-pay | Admitting: Pediatrics

## 2021-07-26 NOTE — Telephone Encounter (Signed)
Forms have been placed on Dr [employer] desk to complete.

## 2021-07-26 NOTE — Telephone Encounter (Signed)
  Who's calling (name and relationship to patient) :Nabozny,Katherine (Mother)  Best contact number: [phone] (Mobile) Provider they see: Deetta Perla, MD Reason for call:  Mom dropped off forms please contact when completed.    PRESCRIPTION REFILL ONLY  Name of prescription:  Pharmacy:

## 2021-07-28 NOTE — Telephone Encounter (Signed)
Forms completed returned to [facility] for disposition.

## 2021-08-11 ENCOUNTER — Other Ambulatory Visit: Payer: Self-pay

## 2021-08-11 ENCOUNTER — Other Ambulatory Visit (INDEPENDENT_AMBULATORY_CARE_PROVIDER_SITE_OTHER): Payer: BC Managed Care – PPO

## 2021-08-11 ENCOUNTER — Ambulatory Visit (HOSPITAL_COMMUNITY)
Admission: RE | Admit: 2021-08-11 | Discharge: 2021-08-11 | Disposition: A | Discharge status: Home / Self Care | Payer: BC Managed Care – PPO | Source: Ambulatory Visit | Attending: Family | Admitting: Family

## 2021-08-11 DIAGNOSIS — G40309 Generalized idiopathic epilepsy and epileptic syndromes, not intractable, without status epilepticus: Secondary | ICD-10-CM

## 2021-08-11 DIAGNOSIS — F840 Autistic disorder: Secondary | ICD-10-CM

## 2021-08-11 DIAGNOSIS — G40A09 Absence epileptic syndrome, not intractable, without status epilepticus: Secondary | ICD-10-CM

## 2021-08-11 DIAGNOSIS — Z79899 Other long term (current) drug therapy: Secondary | ICD-10-CM | POA: Diagnosis not present

## 2021-08-11 DIAGNOSIS — R569 Unspecified convulsions: Secondary | ICD-10-CM | POA: Insufficient documentation

## 2021-08-11 DIAGNOSIS — R259 Unspecified abnormal involuntary movements: Secondary | ICD-10-CM

## 2021-08-11 NOTE — Progress Notes (Signed)
EEG completed, results pending. 

## 2021-08-13 ENCOUNTER — Telehealth (INDEPENDENT_AMBULATORY_CARE_PROVIDER_SITE_OTHER): Payer: Self-pay | Admitting: Pediatrics

## 2021-08-13 NOTE — Telephone Encounter (Signed)
EEG from September 14 was normal.  We can begin to taper or discontinue valproic acid.  Currently patient takes 14 mL 3 times daily.  We will drop to 10 mL 3 times daily for 2 weeks, 6 mL 3 times daily for 2 weeks 2 mL 3 times daily for 2 weeks and then discontinue.  I called mother and left a message for her to call back.

## 2021-08-13 NOTE — Telephone Encounter (Signed)
Mom called back.  This is reflected in the next note today.

## 2021-08-13 NOTE — Telephone Encounter (Signed)
  Who's calling (name and relationship to patient) : Mom Natalia Leatherwood  Best contact [phone] (Mobile)  Provider they see:Dr. Sharene Skeans   Reason for call: returning call for EEG results     PRESCRIPTION REFILL ONLY  Name of prescription:  Pharmacy:

## 2021-08-13 NOTE — Procedures (Signed)
Patient: Andre [facility] MRN: [id number] Sex: male DOB: May 02, 2001  Clinical History: [name] is a 20 y.o. with autism spectrum disorder, level 2 and well-controlled seizures that in the past have included both convulsive and nonconvulsive episodes.  He had an EEG in 2018 that was normal.  He was not tapered off of medication at that time.  The last seizure was over 2 years ago.  The study was performed to look for the presence of seizure activity with an intent to taper or discontinue his medication.  Medications: valproic acid (Depakote)  Procedure: The tracing is carried out on a 32-channel digital Natus recorder, reformatted into 16-channel montages with 1 devoted to EKG.  The patient was awake during the recording.  The international 10/20 system lead placement used.  Recording time 29.7 minutes.   Description of Findings: Dominant frequency is 15 V, 11 hz, alpha range activity that is well modulated and well regulated , posteriorly and symmetrically distributed, and attenuates with eye opening.    Background activity consists of low voltage alpha and beta range activity.  The patient remains awake throughout the record.  There was no interictal epileptiform activity in the form of spikes or sharp waves..  Activating procedures included intermittent photic stimulation, and hyperventilation.  Intermittent photic stimulation induced a driving response at 09-32 hz.  Hyperventilation caused no significant change in background.  EKG showed a sinus tachycardia with a ventricular response of 108 beats per minute.  Impression: This is a normal record with the patient awake.  A normal EEG does not rule out the presence of seizures.  Ellison Carwin, MD

## 2021-08-13 NOTE — Telephone Encounter (Signed)
Mother called back.  I informed her that the EEG was normal and we discussed the schedule for tapering which is present in the telephone note dictated earlier in the day.  If he has breakthrough seizures, she will need to contact the office and we will restart valproic acid at the same dose.  Under those circumstances he will need to return in 6 months.

## 2021-11-03 NOTE — Progress Notes (Signed)
Patient: Andre Holland MRN: [id number] Sex: male DOB: July 04, 2001  Provider: Lorenz Coaster, MD Location of Care: [facility]  Note type: New Patient  History of Present Illness: Referral Source:Andre Hickling, MD  History from: patient and prior records Chief Complaint: autism and seizure disorder   Andre Holland is a 20 y.o. male with history of seizures, autism, insomnia, and tremor who I am seeing by the request of Andre Holland for continued care on concern of autism and seizures as well as tremors. Review of prior history shows patient was last seen by his Andre Holland on 05/17/21 where EEG was ordered to determine if depakene could be weaned and his continued weight loss was discussed. Since then patient was seen in the ED for concern for seizure on 06/26/21 and the EEG was completed on 08/11/21, which was normal, and Depakene was weaned over 6 weeks.  Patient presents today with mother.  They report, he has weaned off of medication. She notes on 11/26 he had an event that looked like a GTC seizure, with shaking that lasted about a minute, she reports he turned a little blue. Was disoriented until sleep 20 min later, which lasted 1-2 hours. Since mom restarted 2 mL of Depakene TID.  06/26/21 had fall, mom thought he had slipped. However, after GTC in November, was disoriented and sleepy in a way that was similar to after the event in July, she now wonders if this was seizure.   With medication, mom wonders if we could switch to BID dosing of the medication, after he runs out of the liquid. When he first started she has not noticed any side effects from the medication.   Dr. Jannifer Holland, prescribes guanfacine and trazodone for sleep as well as, Celexa for mood management.   He has lost some weight. Mom notes he has been more active recently. Had wisdom teeth removal 07/2020, and lost 10 lbs after that due to decreased appetite. She notes he can have days  when he is very hungry and other times where he doesn't eat. Stools regularly.   Patient History:  Diagnosed with Autism when he was 2. Has had PT, OT, ST, and ABA therapy. He is at [employer], in his last year, working on transition planning, he is also getting ST and OT right now. Plan after this is unclear. Has been on the [employer] waiver wait list. Mom has guardianship.  Diagnostics:  EEG 08/11/21 Impression: This is a normal record with the patient awake.  A normal EEG does not rule out the presence of seizures.  Past Medical History History reviewed. No pertinent past medical history.  Birth History 5 lbs. 0 oz. infant born at [redacted] weeks gestational age. 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 Nursery Course was uncomplicated. 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  Surgical History History reviewed. No pertinent surgical history.  Family History family history is not on file.   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   He attends [employer]. This is his last year.    At school he receives ST & OT   He lives with his mom. He has no  siblings.   He enjoys music and watching movies.    Allergies No Known Allergies  Medications Current Outpatient Medications on File Prior to Visit  Medication Sig Dispense Refill   citalopram (CELEXA) 40 MG tablet Take by mouth.     guanFACINE (TENEX) 2 MG tablet Take by mouth.     Multiple Vitamin (MULTIVITAMIN ADULT PO) Take by mouth.     traZODone (DESYREL) 100 MG tablet TAKE 1/2 TO 1 TABLET BY MOUTH AT BEDTIME FOR SLEEP     valproic acid (DEPAKENE) 250 MG/5ML solution  TAKE 14 MLS (700 MG)  THREE TIMES A DAY 3780 mL 5   fluticasone (FLONASE) 50 MCG/ACT nasal spray Place 1 spray into both nostrils daily. (Patient not taking: Reported on 11/08/2021)     No current facility-administered medications on file prior to visit.   The medication list was reviewed and reconciled. All changes or newly prescribed medications were explained.  A complete medication list was provided to the patient/caregiver.  Physical Exam BP 138/77   Pulse (!) 129   Ht 6\' 3"  (1.905 m)   Wt 178 lb (80.7 kg)   BMI 22.25 kg/m  Facility age limit for growth percentiles is 20 years.  No results found. Gen: well appearing child Skin: No rash, No neurocutaneous stigmata. HEENT: Normocephalic, no dysmorphic features, no conjunctival injection, nares patent, mucous membranes moist, oropharynx clear. Neck: Supple, no meningismus. No focal tenderness. Resp: Clear to auscultation bilaterally CV: Regular rate, normal S1/S2, no murmurs, no rubs Abd: BS present, abdomen soft, non-tender, non-distended. No hepatosplenomegaly or mass Ext: Warm and well-perfused. No deformities, no muscle wasting, ROM full.  Neurological Examination: MS: Awake, alert, interactive. Poor eye contact, answers pointed questions with 1 word answers, speech was fluent.  Poor attention in room, mostly plays by herself. Cranial Nerves: Pupils were equal and reactive to light;  EOM normal, no nystagmus; no ptsosis, no double vision, intact facial sensation, face symmetric with full strength of facial muscles, hearing intact grossly.  Motor-Normal tone throughout, Normal strength in all muscle groups. No abnormal movements Reflexes- Reflexes 2+ and symmetric in the biceps, triceps, patellar and achilles tendon. Plantar responses flexor bilaterally, no clonus noted Sensation: Intact to light touch throughout.   Coordination: No dysmetria with reaching for objects    Diagnosis:  Problem List Items Addressed This Visit        Nervous and Auditory   Epilepsy, generalized, convulsive (HCC) - Primary (Chronic)   Relevant Medications   divalproex (DEPAKOTE SPRINKLE) 125 MG capsule   Other Relevant Orders   CBC with Differential   Comprehensive metabolic panel   Valproic Acid level    Assessment and Plan Andre Holland is a 20 y.o. male with history of seizures, autism, insomnia, and tremor who presents for continued care. The events mom describes, seem like seizure, for which I have restarted Depakene, with slow titration back to 10 mL TID. After running out of liquid, I advised mom can switch to 750 mg in capsules so he can take the medication BID. After being on the 750 mg dosage for a week, recommend he receive lab work in a trough of the medication so that I can better understand his dosage. I am unconcerned about his weight loss at this time, however, I advised if he continues to loose weight without trying to monitor for any things that may be decreasing appetite. Additionally, I recommend mom send me evaluations from school so that I may facilitate better social support,  particularly when he graduates from school.   - Restarted Depakene, slow increase back to 10 mL TID, with plan to switch to sprinkles, 750 mg BID.  - Ordered labs to check depakene levels.  - Mom to send evaluations.  I spent 45 minutes on day of service on this patient including review of chart, discussion with patient and family, discussion of screening results, coordination with other providers and management of orders and paperwork.     Return in about 3 months (around 02/06/2022).  I, Mayra Reel, scribed for and in the presence of Andre Coaster, MD at today's visit on 11/08/2021.   I, Andre Coaster MD MPH, personally performed the services described in this documentation, as scribed by Mayra Reel in my presence on 11/08/21, and it is accurate, complete, and reviewed by me.    Andre Coaster MD MPH Neurology and  Neurodevelopment [facility]  [address] [place], [place], [place] [address] Phone: [phone] Fax: [phone]

## 2021-11-08 ENCOUNTER — Ambulatory Visit (INDEPENDENT_AMBULATORY_CARE_PROVIDER_SITE_OTHER): Payer: BC Managed Care – PPO | Admitting: Pediatrics

## 2021-11-08 ENCOUNTER — Encounter (INDEPENDENT_AMBULATORY_CARE_PROVIDER_SITE_OTHER): Payer: Self-pay | Admitting: Pediatrics

## 2021-11-08 ENCOUNTER — Other Ambulatory Visit: Payer: Self-pay

## 2021-11-08 VITALS — BP 138/77 | HR 129 | Ht 75.0 in | Wt 178.0 lb

## 2021-11-08 DIAGNOSIS — G40309 Generalized idiopathic epilepsy and epileptic syndromes, not intractable, without status epilepticus: Secondary | ICD-10-CM

## 2021-11-08 DIAGNOSIS — G4700 Insomnia, unspecified: Secondary | ICD-10-CM

## 2021-11-08 DIAGNOSIS — F840 Autistic disorder: Secondary | ICD-10-CM

## 2021-11-08 MED ORDER — DIVALPROEX SODIUM 125 MG PO CSDR: 750.0000 mg | DELAYED_RELEASE_CAPSULE | Freq: Two times a day (BID) | ORAL | 3 refills | Status: DC

## 2021-11-08 NOTE — Patient Instructions (Addendum)
With Depakene liquid: Increase to 5 mL three times a day, then in one week 7.5 mL three times a day, and then one week after that 10 mL three times a day.  When you run out of liquid, switch to Depakote sprinkes 6 capsules three times a day.  After he has been on 10 mL for a week, bring him in on a Thursday to our office for labwork. She is here 8-5 with a lunch from 12:30-1:30. Please bring him right before he takes his next dose.  Send me any evaluations he has at school.   It was a pleasure to see you in clinic today.    Feel free to contact our office during normal business hours at [phone] with questions or concerns. If there is no answer or the call is outside business hours, please leave a message and our clinic staff will call you back within the next business day.  If you have an urgent concern, please stay on the line for our after-hours answering service and ask for the on-call neurologist.    I also encourage you to use MyChart to communicate with me more directly. If you have not yet signed up for MyChart within [facility], the front desk staff can help you. However, please note that this inbox is NOT monitored on nights or weekends, and response can take up to 2 business days.  Urgent matters should be discussed with the on-call pediatric neurologist.   [facility], we are committed to providing exceptional care. You will receive a patient satisfaction survey through text or email regarding your visit today. Your opinion is important to me. Comments are appreciated.

## 2021-11-15 ENCOUNTER — Encounter (INDEPENDENT_AMBULATORY_CARE_PROVIDER_SITE_OTHER): Payer: Self-pay | Admitting: Pediatrics

## 2021-11-17 ENCOUNTER — Ambulatory Visit (INDEPENDENT_AMBULATORY_CARE_PROVIDER_SITE_OTHER): Payer: BC Managed Care – PPO | Admitting: Pediatrics

## 2021-12-09 ENCOUNTER — Encounter (INDEPENDENT_AMBULATORY_CARE_PROVIDER_SITE_OTHER): Payer: Self-pay | Admitting: Pediatrics

## 2022-01-14 LAB — COMPREHENSIVE METABOLIC PANEL
AG Ratio: 1.4 (calc) (ref 1.0–2.5)
ALT: 12 U/L (ref 9–46)
AST: 12 U/L (ref 10–40)
Albumin: 4.1 g/dL (ref 3.6–5.1)
Alkaline phosphatase (APISO): 90 U/L (ref 36–130)
BUN: 15 mg/dL (ref 7–25)
CO2: 20 mmol/L (ref 20–32)
Calcium: 9.9 mg/dL (ref 8.6–10.3)
Chloride: 107 mmol/L (ref 98–110)
Creat: 0.94 mg/dL (ref 0.60–1.24)
Globulin: 2.9 g/dL (calc) (ref 1.9–3.7)
Glucose, Bld: 104 mg/dL (ref 65–139)
Potassium: 4.3 mmol/L (ref 3.5–5.3)
Sodium: 141 mmol/L (ref 135–146)
Total Bilirubin: 0.4 mg/dL (ref 0.2–1.2)
Total Protein: 7 g/dL (ref 6.1–8.1)

## 2022-01-14 LAB — CBC WITH DIFFERENTIAL/PLATELET
Absolute Monocytes: 614 cells/uL (ref 200–950)
Basophils Absolute: 28 cells/uL (ref 0–200)
Basophils Relative: 0.3 %
Eosinophils Absolute: 28 cells/uL (ref 15–500)
Eosinophils Relative: 0.3 %
HCT: 44.7 % (ref 38.5–50.0)
Hemoglobin: 14.9 g/dL (ref 13.2–17.1)
Lymphs Abs: 1804 cells/uL (ref 850–3900)
MCH: 30 pg (ref 27.0–33.0)
MCHC: 33.3 g/dL (ref 32.0–36.0)
MCV: 89.9 fL (ref 80.0–100.0)
MPV: 11.8 fL (ref 7.5–12.5)
Monocytes Relative: 6.6 %
Neutro Abs: 6826 cells/uL (ref 1500–7800)
Neutrophils Relative %: 73.4 %
Platelets: 216 10*3/uL (ref 140–400)
RBC: 4.97 10*6/uL (ref 4.20–5.80)
RDW: 13 % (ref 11.0–15.0)
Total Lymphocyte: 19.4 %
WBC: 9.3 10*3/uL (ref 3.8–10.8)

## 2022-01-14 LAB — VALPROIC ACID LEVEL: Valproic Acid Lvl: 80.9 mg/L (ref 50.0–100.0)

## 2022-01-26 ENCOUNTER — Telehealth (INDEPENDENT_AMBULATORY_CARE_PROVIDER_SITE_OTHER): Payer: Self-pay | Admitting: Pediatrics

## 2022-01-26 NOTE — Telephone Encounter (Signed)
?  Who's calling (name and relationship to patient) : ?Mom  ?Best contact number: ?[phone] ?Provider they see: Dr. Rogers Blocker  ? ?Reason for call:Mom want to know the lab results and want to know if meds need to be amended  ? ? ? ?PRESCRIPTION REFILL ONLY ? ?Name of prescription: ? ?Pharmacy: ? ? ?

## 2022-01-26 NOTE — Telephone Encounter (Signed)
Spoke with mom and let her know that Dr. Artis Flock has not yet resulted the labs, and is out of the office. She will however return to the office tomorrow. Will route to her so results can be recorded and we can contact mom. Mom states understanding and ended the call.  ?

## 2022-01-28 ENCOUNTER — Telehealth (INDEPENDENT_AMBULATORY_CARE_PROVIDER_SITE_OTHER): Payer: Self-pay

## 2022-01-28 NOTE — Telephone Encounter (Signed)
Results now reviewed.  Please call mom to advise.  ? ?Carylon Perches MD MPH ?

## 2022-01-28 NOTE — Telephone Encounter (Signed)
Spoke with mom and let her know per Dr. Artis Flock "[facility] reviewed, everything looks good.  The labs were drawn at 3pm, so this was not a true trough level of Depakote.  If he is not having seizures, this is fine.  If he does have breakthrough seizures, we have room to go up." ? ?Mom states patient has not had any issues with break through seizures so they will move forward as they have been, and call us if any issues arise.  ?

## 2022-01-28 NOTE — Telephone Encounter (Signed)
-----   Message from Rocky Link, MD sent at 01/28/2022  1:24 PM EST ----- ?Labwork reviewed, everything looks good.  The labs were drawn at 3pm, so this was not a true trough level of Depakote.  If he is not having seizures, this is fine.  If he does have breakthrough seizures, we have room to go up.  ?

## 2022-02-28 ENCOUNTER — Ambulatory Visit (INDEPENDENT_AMBULATORY_CARE_PROVIDER_SITE_OTHER): Payer: BC Managed Care – PPO | Admitting: Pediatrics

## 2022-03-08 NOTE — Progress Notes (Signed)
? ?Patient: Andre Holland MRN: [id number] ?Sex: male DOB: 2001/04/20 ? ?Provider: Lorenz Coaster, MD ?Location of Care: [facility] ? ?Note type: Routine follow-up ? ?History of Present Illness: ? ?Andre Holland is a 21 y.o. male with history of seizures, autism, insomnia, and tremor who I am seeing for routine follow-up. Patient was last seen on 11/08/21 where I restarted Depakene. Since the last appointment, Andre has had labwork where his Depakene level looked good.  ? ?Patient presents today with mom who reports they are working on transition programs for when he graduates. They are working on getting him SSI, and they are awaiting a psychological evaluation  as well as notes from our office.  ? ?Plan right now for when he graduates is to start a "work from home" program and doing some volunteer programs until they can get him into more extensive programs.  ? ?Mom reports they recently transitioned to the depakene pills, it has been a hard transition but they are hopeful that he will improve it. He still prefers this as he does not have to take it 3 times a day. He is taking the pills at 8am and 8pm right now.  ? ?It is a struggle to get him up in the morning and get him ready for school. Right now he has only been able to get to school 1-2 times a week. Mom wonders how much of this is behavioral and how much is sedation.  ? ?Before now he had also been in and out of school due to weight loss and seizure. Recently has been making changes with Dr. Sheron Nightingale (decreasing trazodone and guanfacine) and this has helped him be more awake in the morning he can have days of sleepiness and days where he is awake.  ? ?Generally, his behavior is improved, and he has much fewer meltdowns.  ? ?Past Medical History ?Past Medical History:  ?Diagnosis Date  ? Autism   ? Seizures (HCC)   ? ? ?Surgical History ?History reviewed. No pertinent surgical history. ? ?Family History ?family history  is not on file. ? ? ?Social History ?Social History  ? ?Social History Narrative  ? 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  ? He attends [employer]. This is his last year.   ? At school he receives ST & OT  ? They are working on getting [employer]. They are going to start an evaluation in May.   ? They are on a waiting list for [employer] waiver.   ? If nothing is set up by the time he graduates, he will stay at home with mom.   ? He lives with his mom. He has no siblings.  ? He enjoys music and watching movies.  ? ? ?Allergies ?No Known Allergies ? ?Medications ?Current Outpatient Medications on File Prior to Visit  ?Medication Sig Dispense Refill  ? citalopram (CELEXA) 40 MG tablet Take by mouth.    ? fluticasone (FLONASE) 50 MCG/ACT nasal spray Place 1 spray into both nostrils daily.    ? guanFACINE (TENEX) 2 MG tablet Take by mouth.    ? Multiple Vitamin (MULTIVITAMIN ADULT PO) Take by mouth.    ? traZODone (DESYREL) 100 MG tablet TAKE 1/2 TO 1 TABLET BY MOUTH AT BEDTIME FOR SLEEP    ? ?No current facility-administered medications on file prior to visit.  ? ?The medication list was reviewed and reconciled. All changes or newly prescribed medications were explained.  A complete medication list was provided to the patient/caregiver. ? ?Physical Exam ?Wt 209 lb (94.8 kg)   BMI 26.12 kg/m?  ?Facility age limit for growth percentiles is 20 years.  ?No results found. ?Gen: well appearing young man ?Skin: No rash, No neurocutaneous stigmata. ?HEENT: Normocephalic, no dysmorphic features, no conjunctival injection, nares patent, mucous membranes moist, oropharynx clear. ?Neck: Supple, no meningismus. No focal tenderness. ?Resp: Clear to auscultation bilaterally ?CV: Regular rate, normal S1/S2, no murmurs, no rubs ?Abd: BS present, abdomen soft, non-tender, non-distended. No hepatosplenomegaly or mass ?Ext: Warm and well-perfused. No deformities, no muscle wasting, ROM full. ? ?Neurological  Examination: ?MS: Awake, alert, interactive. Poor eye contact,nonverbal.   Poor attention in room. ?Cranial Nerves: Pupils were equal and reactive to light;  EOM normal, no nystagmus; no ptsosis, no double vision, intact facial sensation, face symmetric with full strength of facial muscles, hearing intact grossly.  ?Motor-Normal tone throughout, Normal strength in all muscle groups. No abnormal movements ?Reflexes- Reflexes 2+ and symmetric in the biceps, triceps, patellar and achilles tendon. Plantar responses flexor bilaterally, no clonus noted ?Sensation: Intact to light touch throughout.   ?Coordination: No dysmetria with reaching for objects ? ? ? ?Diagnosis: ?1. Epilepsy, generalized, convulsive (HCC)   ?2. Autism spectrum disorder with accompanying language impairment and intellectual disability, requiring substantial support   ?3. Insomnia, unspecified type   ?  ? ?Assessment and Plan ?[name] is a 21 y.o. male with history of seizures, autism, insomnia, and tremor who I am seeing in follow-up. Patient is having significant sleepiness during the day, for which I recommend altering the timing of his depakene to 4 pills in the morning and 8 at night. I discussed with mom that the increased dosage at night may cause additional sedation which may allow for less of the other sedating medication he is taking at night. I also reminded her to make sure she is giving the medication early enough that he is not having a hangover effect in the morning.  ? ?- Changed Depakene to 4 pills in the AM and 8 pills in the PM. ?- Sent office notes to [employer] to assist in getting him funding for transitional programs out of school.  ? ?I spent 22 minutes on day of service on this patient including review of chart, discussion with patient and family, discussion of screening results, coordination with other providers and management of orders and paperwork.    ? ?Return in about 3 months (around 06/13/2022). ? ?I, Ellie Canty,  scribed for and in the presence of Lorenz Coaster, MD at today's visit on 03/14/2022.  ? ?I, Lorenz Coaster MD MPH, personally performed the services described in this documentation, as scribed by Mayra Reel in my presence on 03/14/2022 and it is accurate, complete, and reviewed by me.  ? ? ?Lorenz Coaster MD MPH ?Neurology and Neurodevelopment ?[facility] ? ?[address] [place], [place] [address] ?Phone: [phone] ?Fax: [phone]  ?

## 2022-03-14 ENCOUNTER — Encounter (INDEPENDENT_AMBULATORY_CARE_PROVIDER_SITE_OTHER): Payer: Self-pay | Admitting: Pediatrics

## 2022-03-14 ENCOUNTER — Ambulatory Visit (INDEPENDENT_AMBULATORY_CARE_PROVIDER_SITE_OTHER): Payer: BC Managed Care – PPO | Admitting: Pediatrics

## 2022-03-14 VITALS — Wt 209.0 lb

## 2022-03-14 DIAGNOSIS — G4700 Insomnia, unspecified: Secondary | ICD-10-CM | POA: Diagnosis not present

## 2022-03-14 DIAGNOSIS — G40309 Generalized idiopathic epilepsy and epileptic syndromes, not intractable, without status epilepticus: Secondary | ICD-10-CM | POA: Diagnosis not present

## 2022-03-14 DIAGNOSIS — F840 Autistic disorder: Secondary | ICD-10-CM

## 2022-03-14 MED ORDER — DIVALPROEX SODIUM 125 MG PO CSDR: DELAYED_RELEASE_CAPSULE | ORAL | 5 refills | Status: DC

## 2022-03-14 NOTE — Patient Instructions (Signed)
Change his Depakene pills to 4 pills in the morning and 8 pills at night.  ?Make sure the night dose is early enough so he is not still sleepy in the morning.  ?After making these changes talk with Dr. Mervyn Skeeters about needing less medication at night as he has more Depakene at night now.  ?We will directly send our office notes to [employer] to hopefully help that process along.  ? ?

## 2022-03-24 ENCOUNTER — Encounter (INDEPENDENT_AMBULATORY_CARE_PROVIDER_SITE_OTHER): Payer: Self-pay | Admitting: Pediatrics

## 2022-06-13 ENCOUNTER — Ambulatory Visit (INDEPENDENT_AMBULATORY_CARE_PROVIDER_SITE_OTHER): Payer: BC Managed Care – PPO | Admitting: Pediatrics

## 2022-08-10 NOTE — Progress Notes (Signed)
Patient: Andre Holland MRN: [id number] Sex: male DOB: [birth year]  Provider: Lorenz Coaster, MD Location of Care: [facility]  Note type: Routine follow-up  History of Present Illness:  Andre Holland is a 21 y.o. male with history of seizures, autism, insomnia, and tremor who I am seeing for routine follow-up. Patient was last seen on 03/14/22 where I rearranged dosing of his depakene to improve day time sedation.  Since the last appointment, there are no relevant visits noted in the patients chart.    Patient presents today with his mother who reports he has had two seizures since the last visit (one on Aug 30 @7pm  and Sep 16 @12pm ). She notes they lasted 45-60 sec, which was not as long as his usual ones. He also didn't seem quite as disoriented. He is taking his medication, however, she struggles to get him to take the morning dose due to increased agitation at that time. She would prefer to switch back to the liquid dose of medication.   She reports his sleep has been erratic. Falls asleep okay but then will wake up in the middle of the night. He will then be hard to wake up in the morning and can be very tired during the day. She reports that she does not wake him up consistently which may be making this worse.   He does continue to toe walk. Mom reports that it does not seem to hurt him but she worries about his balance and him falling and injuring himself.   Mom notes he is due for his annual teeth cleaning and wonders if this is safe with his recent seizures.   So far he has been volunteering with the animal shelter, does not have an adult day care program, have been waiting on medicaid and the innovations waiver.   Past Medical History Past Medical History:  Diagnosis Date   Autism    Seizures [facility])     Surgical History History reviewed. No pertinent surgical history.  Family History family history is not on file.   Social  History Social History   Social History Narrative   Kamareon is a [employer] [facility].    He attended [employer].    Aul has [employer].   They are on a waiting list for [employer] waiver.    He lives with his mom. He has no siblings.   He enjoys music and watching movies.    Allergies No Known Allergies  Medications Current Outpatient Medications on File Prior to Visit  Medication Sig Dispense Refill   citalopram (CELEXA) 40 MG tablet Take by mouth.     fluticasone (FLONASE) 50 MCG/ACT nasal spray Place 1 spray into both nostrils daily.     guanFACINE (TENEX) 2 MG tablet Take by mouth.     Multiple Vitamin (MULTIVITAMIN ADULT PO) Take by mouth.     traZODone (DESYREL) 100 MG tablet TAKE 1/2 TO 1 TABLET BY MOUTH AT BEDTIME FOR SLEEP     No current facility-administered medications on file prior to visit.   The medication list was reviewed and reconciled. All changes or newly prescribed medications were explained.  A complete medication list was provided to the patient/caregiver.  Physical Exam BP (!) 152/90 (BP Location: Left Arm, Patient Position: Sitting, Cuff Size: Normal) Comment: 1st attempt 162/94  Pulse (!) 114   Ht 6\' 3"  (1.905 m)   Wt 215 lb 9.6 oz (97.8 kg)   BMI  26.95 kg/m  Facility age limit for growth %iles is 20 years.  No results found. Gen: well appearing young man Skin: No rash, No neurocutaneous stigmata. HEENT: Normocephalic, no dysmorphic features, no conjunctival injection, nares patent, mucous membranes moist, oropharynx clear. Neck: Supple, no meningismus. No focal tenderness. Resp: Clear to auscultation bilaterally CV: Regular rate, normal S1/S2, no murmurs, no rubs Abd: BS present, abdomen soft, non-tender, non-distended. No hepatosplenomegaly or mass Ext: Warm and well-perfused. No deformities, no muscle wasting, ROM full.  Neurological Examination: MS: Awake, alert, interactive. Poor eye contact, answers pointed  questions with 1 word answers, speech was fluent.  Poor attention in room. Cranial Nerves: Pupils were equal and reactive to light;  EOM normal, no nystagmus; no ptsosis, no double vision, intact facial sensation, face symmetric with full strength of facial muscles, hearing intact grossly.  Motor-Normal tone throughout, Normal strength in all muscle groups. No abnormal movements Reflexes- Reflexes 2+ and symmetric in the biceps, triceps, patellar and achilles tendon. Plantar responses flexor bilaterally, no clonus noted Sensation: Intact to light touch throughout.   Coordination: No dysmetria with reaching for objects    Diagnosis: 1. Epilepsy, generalized, convulsive (HCC)   2. Autism spectrum disorder with accompanying language impairment and intellectual disability, requiring substantial support   3. Insomnia, unspecified type   4. Toe-walking, habitual      Assessment and Plan Andre Holland is a 21 y.o. male with history of seizures, autism, insomnia, and tremor who I am seeing in follow-up. Patient continues to have breakthrough events despite AED regimen. I would like to get a level of his valproic acid to determine if we may increase it to better control these events. In the mean time, recommend working on improving sleep which may better control her seizures. I will also continue his medication dose with plan to administer liquid in the morning as he is more able to tolerate this, and capsules at night (needed due to high dosage to decrease volume). He has less frustration in the evenings so she is more able to get him to tolerate the change in texture then. Improved compliance with taking the medication may also improve seizure prevention.  Discussed dentist procedure with the family, explained that this is safe, but I would like for there to be a plan in the case that he does have a seizure. Agreed to complete paperwork PRN and prescribed Nayzilam which will be good for him to have on  hand in the case of a prolonged event. Discussed toe walking with mom today as well, will refer for PT as the first line of treatment of this to prevent any further interventions in the future.  - Refilled Depakote with liquid in the morning and capsules at night - Ordered labwork to check the levels of his Depakote - Ordered Nayzilam  - Agreed to complete paperwork for dentistry  - Referred to PT for toe walking  I spent 45 minutes on day of service on this patient including review of chart, discussion with patient and family, discussion of screening results, coordination with other providers and management of orders and paperwork.     Return in about 3 months (around 11/17/2022).  I, Mayra Reel, scribed for and in the presence of Lorenz Coaster, MD at today's visit on 08/18/2022.   I, Lorenz Coaster MD MPH, personally performed the services described in this documentation, as scribed by Mayra Reel in my presence on 08/18/2022 and it is accurate, complete, and reviewed by me.  Lorenz Coaster MD MPH Neurology and Neurodevelopment [facility]  [address] [place], [place], [place] [address] Phone: [phone] Fax: [phone]

## 2022-08-18 ENCOUNTER — Ambulatory Visit (INDEPENDENT_AMBULATORY_CARE_PROVIDER_SITE_OTHER): Payer: Medicaid Other | Admitting: Pediatrics

## 2022-08-18 ENCOUNTER — Encounter (INDEPENDENT_AMBULATORY_CARE_PROVIDER_SITE_OTHER): Payer: Self-pay | Admitting: Pediatrics

## 2022-08-18 VITALS — BP 152/90 | HR 114 | Ht 75.0 in | Wt 215.6 lb

## 2022-08-18 DIAGNOSIS — G40309 Generalized idiopathic epilepsy and epileptic syndromes, not intractable, without status epilepticus: Secondary | ICD-10-CM

## 2022-08-18 DIAGNOSIS — F840 Autistic disorder: Secondary | ICD-10-CM | POA: Diagnosis not present

## 2022-08-18 DIAGNOSIS — R2689 Other abnormalities of gait and mobility: Secondary | ICD-10-CM | POA: Diagnosis not present

## 2022-08-18 DIAGNOSIS — G4700 Insomnia, unspecified: Secondary | ICD-10-CM | POA: Diagnosis not present

## 2022-08-18 MED ORDER — NAYZILAM 5 MG/0.1ML NA SOLN: 5.0000 mg | NASAL | 3 refills | Status: DC | PRN

## 2022-08-18 MED ORDER — VALPROIC ACID 250 MG/5ML PO SOLN: 500.0000 mg | Freq: Every morning | ORAL | 5 refills | Status: DC

## 2022-08-18 MED ORDER — DIVALPROEX SODIUM 125 MG PO CSDR: DELAYED_RELEASE_CAPSULE | ORAL | 5 refills | Status: DC

## 2022-08-18 NOTE — Patient Instructions (Addendum)
I will send his morning dose of Depakote back to liquid and continue the pills at night.  I would also like to get lab work done for him. Try to get this done in the morning before his dose.  I placed an order for [facility] which can stop a seizure longer than 2 min.  Please call me if he has any more seizures.  Once you have this medication, I will write a plan for the dentist to treat a seizure if it were to happen, then he will be cleared to have the sedation.  I do recommend trying to get enough sleep, this can make seizures worse.  Referred for physical therapy today. If dentist sends me paperwork, I am happy to complete it for him to have dental treatment.   [facility] [address], [place],  [place]  [address] Phone: [phone]

## 2022-08-22 ENCOUNTER — Encounter (INDEPENDENT_AMBULATORY_CARE_PROVIDER_SITE_OTHER): Payer: Self-pay | Admitting: Pediatrics

## 2022-08-23 ENCOUNTER — Telehealth (INDEPENDENT_AMBULATORY_CARE_PROVIDER_SITE_OTHER): Payer: Self-pay

## 2022-08-23 NOTE — Telephone Encounter (Signed)
Contacted pt's pharmacy to see if they have pt's updated ins info.  When trying to process pt's PA, coverrmymeds stated that the pt's ins was inactive.  Ebony Hail stated that they did have his new ins info as well as the old. She took the old ins info out and the RX went through.  No PA needed.  SS, CCMA

## 2022-09-11 ENCOUNTER — Other Ambulatory Visit (INDEPENDENT_AMBULATORY_CARE_PROVIDER_SITE_OTHER): Payer: Self-pay | Admitting: Pediatrics

## 2022-09-13 ENCOUNTER — Other Ambulatory Visit: Payer: Self-pay

## 2022-09-13 ENCOUNTER — Encounter: Payer: Self-pay | Admitting: Rehabilitation

## 2022-09-13 ENCOUNTER — Ambulatory Visit: Payer: Medicaid Other | Attending: Pediatrics | Admitting: Rehabilitation

## 2022-09-13 DIAGNOSIS — M25672 Stiffness of left ankle, not elsewhere classified: Secondary | ICD-10-CM | POA: Diagnosis present

## 2022-09-13 DIAGNOSIS — R2689 Other abnormalities of gait and mobility: Secondary | ICD-10-CM | POA: Diagnosis present

## 2022-09-13 DIAGNOSIS — R2681 Unsteadiness on feet: Secondary | ICD-10-CM | POA: Insufficient documentation

## 2022-09-13 DIAGNOSIS — M25671 Stiffness of right ankle, not elsewhere classified: Secondary | ICD-10-CM | POA: Diagnosis present

## 2022-09-13 NOTE — Therapy (Signed)
OUTPATIENT PHYSICAL THERAPY NEURO EVALUATION   Patient Name: Andre Holland MRN: [id number] DOB:[birth year], 21 y.o., male Today's Date: 09/13/2022   PCP: Darrow Bussing, MD REFERRING PROVIDER: Loyal Jacobson, MD    PT End of Session - 09/13/22 1057     Visit Number 1    Number of Visits 15   plus evaluation   Authorization Type Medicaid (awaiting approval)    PT Start Time 1018    PT Stop Time 1055    PT Time Calculation (min) 37 min    Activity Tolerance Other (comment)   limited due to history of autism, nonverbal, difficulty following commands            Past Medical History:  Diagnosis Date   Autism    Seizures (HCC)    History reviewed. No pertinent surgical history. 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  Rationale for Evaluation and Treatment Rehabilitation  SUBJECTIVE:                                                                                                                                                                                              SUBJECTIVE STATEMENT: Per mother, "he's been a toe walker his whole life, but now that he's older, I'm worried about what its doing to his legs and feet. Because he is on his toes, it makes him top heavy and so I'm worried he's going to fall." Pt accompanied by: family member (mother "Georgiann Hahn" Natalia Leatherwood)  PERTINENT HISTORY: 21 y.o. male with history of seizures, autism, insomnia, and tremor    PAIN:  Are you having  pain?  Pt is nonverbal but per mothers report doesn't seem to be in pain when up moving.   PRECAUTIONS: Fall  WEIGHT BEARING RESTRICTIONS No  FALLS: Has patient fallen in last 6 months? No  LIVING ENVIRONMENT: Lives with: lives with their family Lives in: House/apartment Stairs: Yes: Internal: 16 steps; on right going up and External: 2 steps; none Also has another set up stairs going into basement, one rail Has following equipment at home: None  PLOF: Needs assistance with ADLs, has shower stool in shower to sit on for safety.    PATIENT GOALS More  flexibility in feet and more stable on feet.   OBJECTIVE:   DIAGNOSTIC FINDINGS:   COGNITION: Overall cognitive status: History of cognitive impairments - at baseline   SENSATION: Light touch: WFL  COORDINATION: WFL per functional assessment (stairs)   POSTURE: rounded shoulders, forward head, increased thoracic kyphosis, and flexed trunk   LOWER EXTREMITY ROM:     Passive  Right Eval Left Eval  Hip flexion    Hip extension    Hip abduction    Hip adduction    Hip internal rotation    Hip external rotation    Knee flexion    Knee extension    Ankle dorsiflexion -35 -32  Ankle plantarflexion    Ankle inversion    Ankle eversion     (Blank rows = not tested) Tested in seated position (Right in long sit and L in short sit as he changed positions)  LOWER EXTREMITY MMT:    MMT Right Eval Left Eval  Hip flexion    Hip extension    Hip abduction    Hip adduction    Hip internal rotation    Hip external rotation    Knee flexion    Knee extension    Ankle dorsiflexion    Ankle plantarflexion    Ankle inversion    Ankle eversion    (Blank rows = not tested)Grossly WFL, difficult to assess due to cognition    TRANSFERS: Assistive device utilized: None  Sit to stand: SBA Stand to sit: SBA Chair to chair: SBA Floor: SBA (was in floor in lobby and stood up by himself)   STAIRS:  Level of Assistance:  SBA  Stair Negotiation Technique: Alternating Pattern  with Single Rail on Right  Number of Stairs: 4   Height of Stairs: 6  Comments: Pt safe with reciprocal pattern.  Pts mother states that sometimes in the early morning, he will be behind her coming down stairs, holding onto her for support.   GAIT: Gait pattern: step to pattern, step through pattern, decreased arm swing- Right, decreased arm swing- Left, decreased stride length, decreased ankle dorsiflexion- Right, decreased ankle dorsiflexion- Left, and trunk flexed, ambulates in PF on both sides, however R is more flexible than L.   Distance walked: 300' (around gym in session) Assistive device utilized:  mother holding hand to help guide in balance test Level of assistance: SBA and CGA Comments:   FUNCTIONAL TESTs:  Functional gait assessment: 22/30   [facility] PT Assessment - 09/13/22 1038       Functional Gait  Assessment   Gait assessed  Yes    Gait Level Surface Walks 20 ft in less than 5.5 sec, no assistive devices, good speed, no evidence for imbalance, normal gait pattern, deviates no more than 6 in outside of the 12 in walkway width.    Change in Gait Speed Able to smoothly change walking speed without loss of balance or gait deviation. Deviate no more than 6 in outside of the 12 in walkway width.    Gait with Horizontal Head Turns Performs head turns smoothly with slight change in gait velocity (eg, minor disruption to smooth gait path), deviates 6-10 in outside 12 in walkway width, or uses an assistive device.    Gait with Vertical Head Turns Performs task with slight change in gait velocity (eg, minor disruption to smooth gait path), deviates 6 - 10 in outside 12 in walkway width or uses assistive device    Gait and Pivot Turn Pivot  turns safely within 3 sec and stops quickly with no loss of balance.    Step Over Obstacle Is able to step over one shoe box (4.5 in total height) without changing gait speed. No evidence of  imbalance.    Gait with Narrow Base of Support Ambulates less than 4 steps heel to toe or cannot perform without assistance.    Gait with Eyes Closed Walks 20 ft, uses assistive device, slower speed, mild gait deviations, deviates 6-10 in outside 12 in walkway width. Ambulates 20 ft in less than 9 sec but greater than 7 sec.    Ambulating Backwards Walks 20 ft, no assistive devices, good speed, no evidence for imbalance, normal gait    Steps Alternating feet, must use rail.    Total Score 22    FGA comment: 19-24 = medium risk fall                TODAY'S TREATMENT:  No treatment initiated today   PATIENT EDUCATION: Education details: Educated on evaluation results, POC, goals, limitations Person educated: Patient and Parent Education method: Programmer, multimedia, Demonstration, and Verbal cues Education comprehension: verbalized understanding, verbal cues required, and needs further education   HOME EXERCISE PROGRAM: None initiated today    GOALS: Goals reviewed with patient? Yes  SHORT TERM GOALS: Target date: Following third visit   Pt/mother will have initial HEP in order to indicate improved functional mobility and dec fall risk. Baseline: Dependent  Goal status: INITIAL  2.  Pt will demonstrate 5 deg improvement in B ankle DF ROM in order to indicate improved balance.   Baseline: -35 R, -32 L Goal status: INITIAL  3.  Pt will tolerate standing on compliant surfaces x 30 secs in order to indicate improved postural control.  Baseline: Not assessed on eval  Goal status: INITIAL  4.  Pt will improve FGA to >/=25/30 in order to indicate decreased fall risk.  Baseline: 22/30 Goal status: INITIAL  5.  Pt will tolerate standing on incline surfaces x 30 secs in order to indicate improved ankle ROM/tolerance.  Baseline: Not assessed on eval due to time/behavior Goal status: INITIAL    LONG TERM GOALS: Target date:  Following 15th visit   Pt/mother will be IND with  final HEP in order to indicate improved functional mobility and dec fall risk.  Baseline: Depedent  Goal status: INITIAL  2.    Pt will improve FGA to >/=28/30 in order to indicate dec fall risk.  Baseline: 22/30 Goal status: INITIAL  3.  Pt will improve B ankle DF ROM by 10 deg from baseline in order to indicate improved function and balance.  Baseline: -35 R, -32 L  Goal status: INITIAL  4.  Pt will tolerate sitting on physioball x 2 mins unsupported in order to indicate improved core strength/postural control.   Baseline: unable to assess on eval  Goal status: INITIAL  5.  Will request Ortho MD consult if pt/mother willing to do so to address structural changes in foot/ankle.  Baseline: Not wanting to pursue at time of eval  Goal status: INITIAL    ASSESSMENT:  CLINICAL IMPRESSION: Patient is a 21 y.o. male who was seen today for physical therapy evaluation and treatment for toe walking.   Note history of seizures, autism, insomnia, and tremor.  Pt is autistic and nonverbal and therefore had difficulty following commands during session but mother very helpful in guiding him with instruction and tactile cuing.  Note that R ankle/foot is more  flexible but L foot/ankle is more fixed feeling.  I do not feel he would be a good candidate for bracing due to sensory issues and mother feels he would not tolerate as well.  Pt scored 22/30 on FGA indicative of moderate fall risk, however results are swayed due to mother having to hold hand and some tasks being too difficult from a cognitive standpoint to perform fully.  PT would like to assess pt on compliant surfaces and incline surfaces at follow up visit to see if he is able to relax towards heels at all on varying surfaces.  Pt will benefit from skilled OP neuro in order to address deficits.    OBJECTIVE IMPAIRMENTS Abnormal gait, decreased balance, decreased cognition, decreased endurance, decreased mobility, decreased ROM, and impaired  flexibility.   ACTIVITY LIMITATIONS standing, stairs, and locomotion level  PARTICIPATION LIMITATIONS: community activity  PERSONAL FACTORS Behavior pattern, Time since onset of injury/illness/exacerbation, and 1-2 comorbidities: see above  are also affecting patient's functional outcome.   REHAB POTENTIAL: Fair due to cognitive/behavioral deficits  CLINICAL DECISION MAKING: Evolving/moderate complexity  EVALUATION COMPLEXITY: Moderate  PLAN: PT FREQUENCY: 1x/week then 2x/wk for 6 weeks (if needed)  PT DURATION: 4 weeks then 2x/wk for 6 weeks   PLANNED INTERVENTIONS: Therapeutic exercises, Therapeutic activity, Neuromuscular re-education, Balance training, Gait training, Patient/Family education, Self Care, Joint mobilization, Stair training, Orthotic/Fit training, Aquatic Therapy, and Manual therapy  PLAN FOR NEXT SESSION: Assess standing on foam, standing on incline, I wonder if he would get barefoot/socks? To do tasks, get him on the floor for core strengthening, sitting on physioball for core strength.  He likes rock/metal music so could try and work this in for motivation.  Ask if mother would like to request an ortho consult to better assess foot/ankle deformity.    Shandell Sprang, PT, MPT [facility] [address] [place], [place], [address] Phone: [phone]   Fax:  [phone] 09/13/22, 12:30 PM

## 2022-09-20 ENCOUNTER — Ambulatory Visit: Payer: Medicaid Other | Admitting: Physical Therapy

## 2022-09-20 DIAGNOSIS — R2681 Unsteadiness on feet: Secondary | ICD-10-CM | POA: Diagnosis not present

## 2022-09-20 DIAGNOSIS — R2689 Other abnormalities of gait and mobility: Secondary | ICD-10-CM

## 2022-09-20 NOTE — Therapy (Signed)
OUTPATIENT PHYSICAL THERAPY NEURO TREATMENT NOTE   Patient Name: Andre Holland MRN: [id number] DOB:[birth year], 21 y.o., male Today's Date: 09/21/2022   PCP: Darrow Bussing, MD REFERRING PROVIDER: Loyal Jacobson, MD    PT End of Session - 09/21/22 1919     Visit Number 2    Number of Visits 15   plus evaluation   Authorization Type Medicaid (awaiting approval)    PT Start Time 1315    PT Stop Time 1400    PT Time Calculation (min) 45 min    Activity Tolerance Other (comment)   limited due to history of autism, nonverbal, difficulty following commands             Past Medical History:  Diagnosis Date   Autism    Seizures (HCC)    No past surgical history on file. Patient Active Problem List   Diagnosis Date Noted   Insomnia, unspecified 09/16/2020   Drug-induced tremor 09/16/2020   Abnormal involuntary movements 03/11/2020   Non-refractory typical absence seizures (HCC) 03/13/2018   Epilepsy, generalized, convulsive (HCC) 09/20/2017   Autism spectrum disorder with accompanying language impairment and intellectual disability, requiring substantial support 09/20/2017    ONSET DATE: 08/18/2022   REFERRING DIAG: R26.89 (ICD-10-CM) - Toe-walking, habitual   THERAPY DIAG:  Other abnormalities of gait and mobility  Unsteadiness on feet  Rationale for Evaluation and Treatment Rehabilitation  SUBJECTIVE:                                                                                                                                                                                              SUBJECTIVE STATEMENT: No changes or falls reported by mother since eval last week; mother states pt holds head down when in new environment/situations but states he does hold head up at home when he walks; mother asks if any kind of "manipulation" could be done on pt's ankles to increase flexibility Pt accompanied by: family member (mother "Georgiann Hahn" Natalia Leatherwood)  PERTINENT HISTORY: 21  y.o. male with history of seizures, autism, insomnia, and tremor    PAIN:  Are you having pain?  Pt is nonverbal but per mothers report doesn't seem to be in pain when up moving.   PRECAUTIONS: Fall  WEIGHT BEARING RESTRICTIONS No  FALLS: Has patient fallen in last 6 months? No  LIVING ENVIRONMENT: Lives with: lives with their family Lives in: House/apartment Stairs: Yes: Internal: 16 steps; on right going up and External: 2 steps; none Also has another set up stairs going into basement, one rail Has following equipment at home: None  PLOF: Needs assistance with ADLs, has shower stool  in shower to sit on for safety.    PATIENT GOALS More flexibility in feet and more stable on feet.    GAIT: Gait pattern: step to pattern, step through pattern, decreased arm swing- Right, decreased arm swing- Left, decreased stride length, decreased ankle dorsiflexion- Right, decreased ankle dorsiflexion- Left, and trunk flexed, ambulates in PF on both sides, however R is more flexible than L.   Distance walked: 300' (around gym in session) Assistive device utilized:  mother holding hand to help guide in balance test Level of assistance: SBA and CGA Comments:   Today's Treatment:  09-20-22 Discussed recommendation from pediatric PT, [facility], to go ahead and pursue orthopedic consult at this time rather than waiting until problems occur; Mother was given names per Flavia's recommendation - Dr. Kennon Portela at [facility] or Dr. Cleophas Dunker at [facility]  Removed pt's shoes to further examine pt's ankles/feet; pt's Lt ankle is mostly fixed with minimal flexibility noted due to heel cord tightness; attempted passive stretching of Rt ankle as pt would tolerate/allow  Pt amb. In clinic with socks donned (without shoes); attempted various activities to address balance and core strengthening but pt participating minimally Pt did stand on Airex with socks on, without shoes but leaned against wall for support  with balance  Attempted pt sitting blue physioball - pt would not attempt  Pt stood on mini trampoline for approx. 15 secs with bil UE support - would not attempt jumping - then lied down on trampoline  Pt walked up ramp but would not stand in place at top of ramp to work on ankle stretching with posterior weight shifting  Attempted tall kneeling on red mat on floor - pt transferred standing to knees to lying down on mat - would not get in tall kneeling position  Attempted kicking small ball - pt kicked once and returned to sitting; then attempted to have pt kick bean bags to facilitate increased SLS on each leg but pt would not comply - stood on each bean bag so activity was discontinued  Discussed recommendation of horseback riding for pt - Mother stated that pt did this in the past and "was not a fan of it"       PATIENT EDUCATION: Education details: Mother requested a list of possible activities that she could try with pt at home in familiar environment since pt would not comply in today's session Person educated: Patient and Parent Education method: Explanation, Demonstration, and Verbal cues Education comprehension: verbalized understanding, verbal cues required, and needs further education   HOME EXERCISE PROGRAM: None initiated today    GOALS: Goals reviewed with patient? Yes  SHORT TERM GOALS: Target date: Following third visit   Pt/mother will have initial HEP in order to indicate improved functional mobility and dec fall risk. Baseline: Dependent  Goal status: INITIAL  2.  Pt will demonstrate 5 deg improvement in B ankle DF ROM in order to indicate improved balance.   Baseline: -35 R, -32 L Goal status: INITIAL  3.  Pt will tolerate standing on compliant surfaces x 30 secs in order to indicate improved postural control.  Baseline: Not assessed on eval  Goal status: INITIAL  4.  Pt will improve FGA to >/=25/30 in order to indicate decreased fall risk.   Baseline: 22/30 Goal status: INITIAL  5.  Pt will tolerate standing on incline surfaces x 30 secs in order to indicate improved ankle ROM/tolerance.  Baseline: Not assessed on eval due to time/behavior Goal status: INITIAL  LONG TERM GOALS: Target date:  Following 15th visit   Pt/mother will be IND with final HEP in order to indicate improved functional mobility and dec fall risk.  Baseline: Depedent  Goal status: INITIAL  2.    Pt will improve FGA to >/=28/30 in order to indicate dec fall risk.  Baseline: 22/30 Goal status: INITIAL  3.  Pt will improve B ankle DF ROM by 10 deg from baseline in order to indicate improved function and balance.  Baseline: -35 R, -32 L  Goal status: INITIAL  4.  Pt will tolerate sitting on physioball x 2 mins unsupported in order to indicate improved core strength/postural control.   Baseline: unable to assess on eval  Goal status: INITIAL  5.  Will request Ortho MD consult if pt/mother willing to do so to address structural changes in foot/ankle.  Baseline: Not wanting to pursue at time of eval  Goal status: INITIAL    ASSESSMENT:  CLINICAL IMPRESSION: PT session focused on activities to increase ankle dorsiflexion and to attempt posterior weight shift. Pt would not comply with majority of activities in today's session; mother requested a list of activities that she could try with pt at home in his familiar environment.  Mother also stated that pt holds his head up at home when he walks as he holds head flexed during PT session including gait, resulting in anterior COG which impacts balance.  Cont with POC.    OBJECTIVE IMPAIRMENTS Abnormal gait, decreased balance, decreased cognition, decreased endurance, decreased mobility, decreased ROM, and impaired flexibility.   ACTIVITY LIMITATIONS standing, stairs, and locomotion level  PARTICIPATION LIMITATIONS: community activity  PERSONAL FACTORS Behavior pattern, Time since onset of  injury/illness/exacerbation, and 1-2 comorbidities: see above  are also affecting patient's functional outcome.   REHAB POTENTIAL: Fair due to cognitive/behavioral deficits  CLINICAL DECISION MAKING: Evolving/moderate complexity  EVALUATION COMPLEXITY: Moderate  PLAN: PT FREQUENCY: 1x/week then 2x/wk for 6 weeks (if needed)  PT DURATION: 4 weeks then 2x/wk for 6 weeks   PLANNED INTERVENTIONS: Therapeutic exercises, Therapeutic activity, Neuromuscular re-education, Balance training, Gait training, Patient/Family education, Self Care, Joint mobilization, Stair training, Orthotic/Fit training, Aquatic Therapy, and Manual therapy  PLAN FOR NEXT SESSION: Assess standing on foam, standing on incline, I wonder if he would get barefoot/socks? To do tasks, get him on the floor for core strengthening, sitting on physioball for core strength.  He likes rock/metal music so could try and work this in for motivation.  Ask if mother would like to request an ortho consult to better assess foot/ankle deformity.    Guido Sander, [place] [address] [place], [place], [address] Phone: [phone]   Fax:  [phone] 09/21/22, 7:21 PM

## 2022-09-27 ENCOUNTER — Ambulatory Visit: Payer: Medicaid Other | Admitting: Physical Therapy

## 2022-09-27 ENCOUNTER — Encounter: Payer: Self-pay | Admitting: Physical Therapy

## 2022-09-27 DIAGNOSIS — R2681 Unsteadiness on feet: Secondary | ICD-10-CM | POA: Diagnosis not present

## 2022-09-27 DIAGNOSIS — R2689 Other abnormalities of gait and mobility: Secondary | ICD-10-CM

## 2022-09-27 NOTE — Therapy (Signed)
OUTPATIENT PHYSICAL THERAPY NEURO TREATMENT NOTE   Patient Name: Andre Holland MRN: [id number] DOB:08-17-2001, 21 y.o., male Today's Date: 09/27/2022   PCP: Andre Amel, MD REFERRING PROVIDER: Jefm Petty, MD    PT End of Session - 09/27/22 1911     Visit Number 3    Number of Visits 15   plus evaluation   Authorization Type Medicaid (awaiting approval)    Authorization Time Period 3 visits 09-20-22 - 10-10-22    Authorization - Visit Number 3    PT Start Time [id number]    PT Stop Time 1145    PT Time Calculation (min) 40 min    Activity Tolerance Other (comment)   limited due to history of autism, nonverbal, difficulty following commands   Behavior During Therapy [facility] for tasks assessed/performed              Past Medical History:  Diagnosis Date   Autism    Seizures ([place])    History reviewed. No pertinent surgical history. Patient Active Problem List   Diagnosis Date Noted   Insomnia, unspecified 09/16/2020   Drug-induced tremor 09/16/2020   Abnormal involuntary movements 03/11/2020   Non-refractory typical absence seizures ([place]) 03/13/2018   Epilepsy, generalized, convulsive ([place]) 09/20/2017   Autism spectrum disorder with accompanying language impairment and intellectual disability, requiring substantial support 09/20/2017    ONSET DATE: 08/18/2022   REFERRING DIAG: R26.89 (ICD-10-CM) - Toe-walking, habitual   THERAPY DIAG:  Other abnormalities of gait and mobility  Unsteadiness on feet  Rationale for Evaluation and Treatment Rehabilitation  SUBJECTIVE:                                                                                                                                                                                              SUBJECTIVE STATEMENT: Mother states she received list of activities emailed to her to try with pt at home; has not thought to call orthopedic MD yet to schedule consult for pt Pt accompanied by: family member  (mother "Andre Holland" Andre Holland)  PERTINENT HISTORY: 21 y.o. male with history of seizures, autism, insomnia, and tremor    PAIN:  Are you having pain?  Pt is nonverbal but per mothers report doesn't seem to be in pain when up moving.   PRECAUTIONS: Fall  WEIGHT BEARING RESTRICTIONS No  FALLS: Has patient fallen in last 6 months? No  LIVING ENVIRONMENT: Lives with: lives with their family Lives in: House/apartment Stairs: Yes: Internal: 16 steps; on right going up and External: 2 steps; none Also has another set up stairs going into basement, one rail Has following equipment at home: None  PLOF: Needs assistance with ADLs, has shower stool in shower to sit on for safety.    PATIENT GOALS More flexibility in feet and more stable on feet.    TREATMENT:  09-27-22   GAIT: Gait pattern: step to pattern, step through pattern, decreased arm swing- Right, decreased arm swing- Left, decreased stride length, decreased ankle dorsiflexion- Right, decreased ankle dorsiflexion- Left, and trunk flexed, ambulates in PF on both sides, however R is more flexible than L.   Distance walked: 300' (around gym in session) Assistive device utilized:  mother holding hand to help guide in balance test Level of assistance: SBA and CGA Comments:     Andre Holland, PT (evaluating therapist) present for co-treat in today's session:  Removed pt's shoes to further examine pt's ankles/feet; pt's Lt ankle is mostly fixed with minimal flexibility noted due to heel cord tightness; attempted passive stretching of Rt ankle as pt would tolerate/allow - some minimal active dorsiflexion achieved with passive stretching of heel cord  Pt amb. In clinic with socks donned (without shoes); pt amb. 115' in clinic around track for gait analysis by primary PT, Andre Holland: Lt foot noted to supinate slightly in stance (Lt foot more contracted in heel cord than Rt foot  THERACT:  Used music on mother's phone to assist with  coercion for participation during activities  Tall kneeling on red mat on floor  - pt performed reaching in various locations and tossing bean bags in basket while in tall kneeling position with CGA to min assist - pt stayed in tall kneeling position for 1" 10 secs with this activity  Quadruped position on mat on floor - pt maintained position for 15 secs during 1st rep, then 20 secs 2nd rep - used colored discs for targets with pt touching colored discs for core stabilization   Pt stood on rockerboard without shoes donned - board was placed in corner for safety - pt stood briefly on rockerboard without touching walls; attempted rocking on board for weight shifting and to facilitate dorsiflexion/heel cord stretching but pt used more hip strategy in compensation and leaned against wall for stability  Pt stood on Airex (without shoes donned) - leaned against wall for stability with hesitation to attempt standing unsupported on Airex  Attempted prone position - pt transferred to mat table and lied on Rt side       PATIENT EDUCATION: Education details:  List of activities emailed to pt's mother on 09-25-22 - see below Person educated: Patient and Parent Education method: Explanation, Demonstration, and Verbal cues Education comprehension: verbalized understanding, verbal cues required, and needs further education   ACTIVITIES/EXERCISES TO WORK ON CORE STABILIZATION AND BALANCE    1) Use of physioball  - sitting on ball, lift one arm up, then extend one knee; progress to lifting opposite arm and leg;  seated marching on ball - can hold small ball or object in hands, progress to holding object with elbows extended (away from body)    2) Mini trampoline - standing, marching, jumping   3)  Tall kneeling - on both knees; toss/catch a ball in this position (Zoom ball)   4)  Standing on compliant surface (pillows/cushions or soft blanket) for balance; marching on this surface; standing with Eyes  closed for 10 secs; practice  head turns side to side 5-10 reps with eyes open and then up/down with eyes open; for more challenge - perform head turns with eyes closed   5) Practice kicking a ball (Soccer)  or kicking bean bags   6) Rockerboard or balance board     Games - Zoomball, HyperDash, balance board     HOME EXERCISE PROGRAM: None initiated today    GOALS: Goals reviewed with patient? Yes  SHORT TERM GOALS: Target date: Following third visit   Pt/mother will have initial HEP in order to indicate improved functional mobility and dec fall risk. Baseline: Dependent  Goal status: INITIAL  2.  Pt will demonstrate 5 deg improvement in B ankle DF ROM in order to indicate improved balance.   Baseline: -35 R, -32 L Goal status: INITIAL  3.  Pt will tolerate standing on compliant surfaces x 30 secs in order to indicate improved postural control.  Baseline: Not assessed on eval  Goal status: INITIAL  4.  Pt will improve FGA to >/=25/30 in order to indicate decreased fall risk.  Baseline: 22/30 Goal status: INITIAL  5.  Pt will tolerate standing on incline surfaces x 30 secs in order to indicate improved ankle ROM/tolerance.  Baseline: Not assessed on eval due to time/behavior Goal status: INITIAL    LONG TERM GOALS: Target date:  Following 15th visit   Pt/mother will be IND with final HEP in order to indicate improved functional mobility and dec fall risk.  Baseline: Depedent  Goal status: INITIAL  2.    Pt will improve FGA to >/=28/30 in order to indicate dec fall risk.  Baseline: 22/30 Goal status: INITIAL  3.  Pt will improve B ankle DF ROM by 10 deg from baseline in order to indicate improved function and balance.  Baseline: -35 R, -32 L  Goal status: INITIAL  4.  Pt will tolerate sitting on physioball x 2 mins unsupported in order to indicate improved core strength/postural control.   Baseline: unable to assess on eval  Goal status: INITIAL  5.  Will  request Ortho MD consult if pt/mother willing to do so to address structural changes in foot/ankle.  Baseline: Not wanting to pursue at time of eval  Goal status: INITIAL    ASSESSMENT:  CLINICAL IMPRESSION: PT session focused on tall kneeling and quadruped positions to improve core stabilization and attempted standing balance exercises on rockerboard and compliant surface (Airex) to facilitate posterior weight shift for heel cord stretching.  [name] longest duration in quadruped position was 20 secs and in tall kneeling was 1" 10 secs.  Progress is limited by pt's lack of compliance/behavior due to autism.   Cont with POC.    OBJECTIVE IMPAIRMENTS Abnormal gait, decreased balance, decreased cognition, decreased endurance, decreased mobility, decreased ROM, and impaired flexibility.   ACTIVITY LIMITATIONS standing, stairs, and locomotion level  PARTICIPATION LIMITATIONS: community activity  PERSONAL FACTORS Behavior pattern, Time since onset of injury/illness/exacerbation, and 1-2 comorbidities: see above  are also affecting patient's functional outcome.   REHAB POTENTIAL: Fair due to cognitive/behavioral deficits  CLINICAL DECISION MAKING: Evolving/moderate complexity  EVALUATION COMPLEXITY: Moderate  PLAN: PT FREQUENCY: 1x/week then 2x/wk for 6 weeks (if needed)  PT DURATION: 4 weeks then 2x/wk for 6 weeks   PLANNED INTERVENTIONS: Therapeutic exercises, Therapeutic activity, Neuromuscular re-education, Balance training, Gait training, Patient/Family education, Self Care, Joint mobilization, Stair training, Orthotic/Fit training, Aquatic Therapy, and Manual therapy  PLAN FOR NEXT SESSION:  Try heel digs sitting in rolling chair? To do tasks, get him on the floor for core strengthening, sitting on physioball for core strength.  He likes rock/metal music so could try and work this in for motivation.  Ask if mother  would like to request an ortho consult to better assess foot/ankle  deformity.    Guido Sander, [place] [address] [place], [place], [address] Phone: [phone]   Fax:  [phone] 09/27/22, 7:14 PM

## 2022-10-04 ENCOUNTER — Encounter: Payer: Self-pay | Admitting: Rehabilitation

## 2022-10-04 ENCOUNTER — Ambulatory Visit: Payer: Medicaid Other | Attending: Pediatrics | Admitting: Rehabilitation

## 2022-10-04 DIAGNOSIS — R2681 Unsteadiness on feet: Secondary | ICD-10-CM | POA: Insufficient documentation

## 2022-10-04 DIAGNOSIS — R2689 Other abnormalities of gait and mobility: Secondary | ICD-10-CM | POA: Diagnosis present

## 2022-10-04 DIAGNOSIS — M25672 Stiffness of left ankle, not elsewhere classified: Secondary | ICD-10-CM | POA: Diagnosis present

## 2022-10-04 DIAGNOSIS — M25671 Stiffness of right ankle, not elsewhere classified: Secondary | ICD-10-CM | POA: Diagnosis present

## 2022-10-04 NOTE — Therapy (Signed)
OUTPATIENT PHYSICAL THERAPY NEURO TREATMENT NOTE   Patient Name: Andre Holland MRN: [id number] DOB:06-03-2001, 21 y.o., male Today's Date: 10/05/2022   PCP: Lujean Amel, MD REFERRING PROVIDER: Jefm Petty, MD    PT End of Session - 10/04/22 1329     Visit Number 4    Number of Visits 15   plus evaluation   Authorization Type Medicaid (awaiting approval)    Authorization Time Period 3 visits 09-20-22 - 10-10-22    Authorization - Visit Number 3    Authorization - Number of Visits 3    PT Start Time 7782    PT Stop Time 1400    PT Time Calculation (min) 43 min    Activity Tolerance Other (comment)   limited due to history of autism, nonverbal, difficulty following commands   Behavior During Therapy [facility] for tasks assessed/performed              Past Medical History:  Diagnosis Date   Autism    Seizures ([place])    History reviewed. No pertinent surgical history. 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   Autism spectrum disorder with accompanying language impairment and intellectual disability, requiring substantial support 09/20/2017    ONSET DATE: 08/18/2022   REFERRING DIAG: R26.89 (ICD-10-CM) - Toe-walking, habitual   THERAPY DIAG:  Other abnormalities of gait and mobility  Stiffness of left ankle, not elsewhere classified  Unsteadiness on feet  Stiffness of right ankle, not elsewhere classified  Rationale for Evaluation and Treatment Rehabilitation  SUBJECTIVE:                                                                                                                                                                                              SUBJECTIVE STATEMENT: Mother reports no changes, they have tried some  of the exercises, but has same issues as we have in clinic getting him to maintain an activity.  She also reports she has not contacted ortho MD but would prefer referral to [facility] here in town.   Pt accompanied by: family member (mother "Kat" Belenda Cruise)  PERTINENT HISTORY: 21 y.o. male with history of seizures, autism, insomnia, and tremor    PAIN:  Are you having pain?  Pt is nonverbal but per mothers report doesn't seem to be in pain when up moving.   PRECAUTIONS: Fall  WEIGHT BEARING RESTRICTIONS No  FALLS: Has patient fallen in last 6 months?  No  LIVING ENVIRONMENT: Lives with: lives with their family Lives in: House/apartment Stairs: Yes: Internal: 16 steps; on right going up and External: 2 steps; none Also has another set up stairs going into basement, one rail Has following equipment at home: None  PLOF: Needs assistance with ADLs, has shower stool in shower to sit on for safety.    PATIENT GOALS More flexibility in feet and more stable on feet.    TREATMENT:  10/04/22   GAIT: Gait pattern: step to pattern, step through pattern, decreased arm swing- Right, decreased arm swing- Left, decreased stride length, decreased ankle dorsiflexion- Right, decreased ankle dorsiflexion- Left, and trunk flexed, ambulates in PF on both sides, however R is more flexible than L.   Distance walked: 300' (around gym in session) Assistive device utilized:  mother holding hand to help guide in balance test Level of assistance: SBA and CGA Comments:       [facility] PT Assessment - 10/04/22 1331       Functional Gait  Assessment   Gait assessed  Yes    Gait Level Surface Walks 20 ft in less than 5.5 sec, no assistive devices, good speed, no evidence for imbalance, normal gait pattern, deviates no more than 6 in outside of the 12 in walkway width.    Change in Gait Speed Able to smoothly change walking speed without loss of balance or gait deviation. Deviate no more than 6 in outside of  the 12 in walkway width.    Gait with Horizontal Head Turns Performs head turns smoothly with no change in gait. Deviates no more than 6 in outside 12 in walkway width    Gait with Vertical Head Turns Performs task with slight change in gait velocity (eg, minor disruption to smooth gait path), deviates 6 - 10 in outside 12 in walkway width or uses assistive device    Gait and Pivot Turn Pivot turns safely within 3 sec and stops quickly with no loss of balance.    Step Over Obstacle Is able to step over 2 stacked shoe boxes taped together (9 in total height) without changing gait speed. No evidence of imbalance.    Gait with Narrow Base of Support Ambulates less than 4 steps heel to toe or cannot perform without assistance.    Gait with Eyes Closed Walks 20 ft, uses assistive device, slower speed, mild gait deviations, deviates 6-10 in outside 12 in walkway width. Ambulates 20 ft in less than 9 sec but greater than 7 sec.    Ambulating Backwards Walks 20 ft, no assistive devices, good speed, no evidence for imbalance, normal gait    Steps Alternating feet, must use rail.    Total Score 24            Completed FGA as above with HHA for participation during tasks.  It continues to be difficult to discern what balance issues he has due to behaviors and difficulty following commands.  He also takes several seated rest breaks during testing.   Theract:  Continue to attempt exercises from previously issued HEP and assessing remaining STGs.  Pt was willing to stand on foam surface, however only able to remain in position for 10 secs before walking back to chair.  Same with standing on simulated ramp (standing on back part of rocker board).  He would get into position x 2-3 reps but would not maintain in position for more than 5-10 secs before getting down again.    Pt did  sit on rolling stool during session and attempted to have him do a modified crab walk by walking himself forward.  He did do a few  steps, however he quickly gets up and sits back down in arm chair.  Feel that this would be good activity if can do at home with back supported in office chair.  Added to HEP.  Attempted tall kneel with zoom ball and he did do several reps with hand over hand assist from PT for technique and remained in tall kneel for up to 20 secs before getting up and heading back to room.    Ankle/foot ROM remains unchanged from previous assessment.   Self care:  Discussed POC and after speaking with fellow pediatric PT at [facility], feel that PT will likely not make a change in his ankle or foot ROM as it seems fairly fixed.  Would recommend that he see ortho MD and see what they say.  Discussed that PT would recommend they continue with HEP at home as I feel with more consistent practice, this will help with pt compliance and increasing time spent doing exercises.  Feel that we can go over remaining exercises at next visit with plan to wrap up on this visit.  Mother verbalized understanding.       PATIENT EDUCATION: Education details:  Updated HEP emailed to mother 10/05/22 Person educated: Patient and Parent Education method: Explanation, Demonstration, and Verbal cues Education comprehension: verbalized understanding, verbal cues required, and needs further education   ACTIVITIES/EXERCISES TO WORK ON CORE STABILIZATION AND BALANCE    1) Use of physioball  - sitting on ball, lift one arm up, then extend one knee; progress to lifting opposite arm and leg;  seated marching on ball - can hold small ball or object in hands, progress to holding object with elbows extended (away from body)   2) Mini trampoline - standing, marching, jumping   3)  Tall kneeling - on both knees; toss/catch a ball in this position (Zoom ball)   4)  Standing on compliant surface (pillows/cushions or soft blanket) for balance; marching on this surface; standing with Eyes closed for 10 secs; practice  head turns side to side 5-10  reps with eyes open and then up/down with eyes open; for more challenge - perform head turns with eyes closed   5) Practice kicking a ball (Soccer) or kicking bean bags   6) Rockerboard or balance board     Games - Zoomball, HyperDash, balance board    Added: Sitting in office chair at, trying to have him "walk" himself forward in chair.   On knees with hands on large physioball, working on rolling ball forward, side to side, alternating lifting arms.     HOME EXERCISE PROGRAM: See above    GOALS: Goals reviewed with patient? Yes  SHORT TERM GOALS: Target date: Following third visit   Pt/mother will have initial HEP in order to indicate improved functional mobility and dec fall risk. Baseline: Dependent  Goal status: IN PROGRESS  2.  Pt will demonstrate 5 deg improvement in B ankle DF ROM in order to indicate improved balance.   Baseline: -35 R, -32 L Goal status: NOT MET  3.  Pt will tolerate standing on compliant surfaces x 30 secs in order to indicate improved postural control.  Baseline: Not assessed on eval  Goal status: NOT MET  4.  Pt will improve FGA to >/=25/30 in order to indicate decreased fall risk.  Baseline: 22/30  to 24/30 Goal status: IN PROGRESS  5.  Pt will tolerate standing on incline surfaces x 30 secs in order to indicate improved ankle ROM/tolerance.  Baseline: Not assessed on eval due to time/behavior Goal status: IN PROGRESS    LONG TERM GOALS: Target date:  Following 15th visit   Pt/mother will be IND with final HEP in order to indicate improved functional mobility and dec fall risk.  Baseline: Depedent  Goal status: INITIAL  2.    Pt will improve FGA to >/=28/30 in order to indicate dec fall risk.  Baseline: 22/30 Goal status: INITIAL  3.  Pt will improve B ankle DF ROM by 10 deg from baseline in order to indicate improved function and balance.  Baseline: -35 R, -32 L  Goal status: INITIAL  4.  Pt will tolerate sitting on physioball  x 2 mins unsupported in order to indicate improved core strength/postural control.   Baseline: unable to assess on eval  Goal status: INITIAL  5.  Will request Ortho MD consult if pt/mother willing to do so to address structural changes in foot/ankle.  Baseline: Not wanting to pursue at time of eval  Goal status: INITIAL    ASSESSMENT:  CLINICAL IMPRESSION: Skilled session focused on assessment of STGs.  He has not met any goals but has made progress with balance (difficult to accurately discern due to needing HHA and cognitive deficits), but he is unwilling to remain on compliant or ramp surfaces for more than 5-10 secs but does seem more willing to do exercises from HEP.  Recommend they continue with these at home, will practice a few more at next session with plans to wrap up and see ortho MD.     OBJECTIVE IMPAIRMENTS Abnormal gait, decreased balance, decreased cognition, decreased endurance, decreased mobility, decreased ROM, and impaired flexibility.   ACTIVITY LIMITATIONS standing, stairs, and locomotion level  PARTICIPATION LIMITATIONS: community activity  PERSONAL FACTORS Behavior pattern, Time since onset of injury/illness/exacerbation, and 1-2 comorbidities: see above  are also affecting patient's functional outcome.   REHAB POTENTIAL: Fair due to cognitive/behavioral deficits  CLINICAL DECISION MAKING: Evolving/moderate complexity  EVALUATION COMPLEXITY: Moderate  PLAN: PT FREQUENCY: 1x/week then 2x/wk for 6 weeks (if needed)  PT DURATION: 4 weeks then 2x/wk for 6 weeks   PLANNED INTERVENTIONS: Therapeutic exercises, Therapeutic activity, Neuromuscular re-education, Balance training, Gait training, Patient/Family education, Self Care, Joint mobilization, Stair training, Orthotic/Fit training, Aquatic Therapy, and Manual therapy  PLAN FOR NEXT SESSION:  Try more exercises from HEP to ensure he can do with mother.  Wrap up and D/C, ensure referral came through for  murphy wainer.   Thelbert Sprang, PT, MPT [facility] [address] [place], [place], [address] Phone: [phone]   Fax:  [phone] 10/05/22, 8:54 AM

## 2022-10-05 ENCOUNTER — Telehealth: Payer: Self-pay | Admitting: Rehabilitation

## 2022-10-05 DIAGNOSIS — M25671 Stiffness of right ankle, not elsewhere classified: Secondary | ICD-10-CM

## 2022-10-05 DIAGNOSIS — M25672 Stiffness of left ankle, not elsewhere classified: Secondary | ICD-10-CM

## 2022-10-05 NOTE — Telephone Encounter (Signed)
Dr. Artis Flock,   I am seeing Andre Holland here at [facility] for PT.  We have worked with patient for 4 visits at this time and I do not feel that PT will make a huge change with ankle/foot ROM.  He is functional at this time and we have provided an extensive HEP for them to work on at home.  I would recommend he be referred to an ortho MD to see what they say about his feet.  Our pediatric PT had recommended a couple MDs however mother would prefer to be referred to [facility].  Could you please write this referral and have them contact them?   Thanks so much! Harriet Butte, PT, MPT [facility] [address] [place], [place], [address] Phone: [phone]   Fax:  [phone] 10/05/22, 8:57 AM

## 2022-10-06 NOTE — Telephone Encounter (Signed)
Thank you Irving Burton.  I figured I would try PT first, but this all makes sense.  [facility] doesn't see kids, but I would think they could hand this in an adult. I'll send the referral.   Lorenz Coaster MD MPH

## 2022-10-11 ENCOUNTER — Ambulatory Visit: Payer: Medicaid Other | Admitting: Rehabilitation

## 2022-10-13 ENCOUNTER — Encounter: Payer: Self-pay | Admitting: Rehabilitation

## 2022-10-13 ENCOUNTER — Ambulatory Visit: Payer: Medicaid Other | Admitting: Rehabilitation

## 2022-10-13 DIAGNOSIS — M25671 Stiffness of right ankle, not elsewhere classified: Secondary | ICD-10-CM

## 2022-10-13 DIAGNOSIS — R2689 Other abnormalities of gait and mobility: Secondary | ICD-10-CM

## 2022-10-13 DIAGNOSIS — M25672 Stiffness of left ankle, not elsewhere classified: Secondary | ICD-10-CM

## 2022-10-13 DIAGNOSIS — R2681 Unsteadiness on feet: Secondary | ICD-10-CM

## 2022-10-13 NOTE — Therapy (Signed)
OUTPATIENT PHYSICAL THERAPY NEURO TREATMENT NOTE   Patient Name: Andre Holland MRN: [id number] DOB:Jan 25, 2001, 21 y.o., male Today's Date: 10/13/2022   PCP: Lujean Amel, MD REFERRING PROVIDER: Jefm Petty, MD    PT End of Session - 10/13/22 1321     Visit Number 5    Number of Visits 15   plus evaluation   Authorization Type Medicaid (awaiting approval)    Authorization Time Period 3 visits 09-20-22 - 10-10-22    Authorization - Number of Visits 3    PT Start Time 1107    PT Stop Time 1146    PT Time Calculation (min) 39 min    Activity Tolerance Other (comment);Patient tolerated treatment well   limited due to history of autism, nonverbal, difficulty following commands   Behavior During Therapy [facility] for tasks assessed/performed              Past Medical History:  Diagnosis Date   Autism    Seizures ([place])    History reviewed. No pertinent surgical history. 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   Autism spectrum disorder with accompanying language impairment and intellectual disability, requiring substantial support 09/20/2017    ONSET DATE: 08/18/2022   REFERRING DIAG: R26.89 (ICD-10-CM) - Toe-walking, habitual   THERAPY DIAG:  Stiffness of left ankle, not elsewhere classified  Stiffness of right ankle, not elsewhere classified  Other abnormalities of gait and mobility  Unsteadiness on feet  Rationale for Evaluation and Treatment Rehabilitation  SUBJECTIVE:                                                                                                                                                                                              SUBJECTIVE STATEMENT: No changes, they have been working on exercises.     Pt accompanied by: family member (mother "Kat" Belenda Cruise)  PERTINENT HISTORY: 21 y.o. male with history of seizures, autism, insomnia, and tremor    PAIN:  Are you having pain?  Pt is nonverbal but per mothers report doesn't seem to be in pain when up moving.   PRECAUTIONS: Fall  WEIGHT BEARING RESTRICTIONS No  FALLS: Has patient fallen in last 6 months? No  LIVING ENVIRONMENT: Lives with: lives with their family Lives in: House/apartment Stairs: Yes: Internal: 16 steps; on right going up and External: 2 steps; none Also has another set up stairs going into basement, one rail Has following equipment at  home: None  PLOF: Needs assistance with ADLs, has shower stool in shower to sit on for safety.    PATIENT GOALS More flexibility in feet and more stable on feet.    TREATMENT:  10/13/22   GAIT: Gait pattern: step to pattern, step through pattern, decreased arm swing- Right, decreased arm swing- Left, decreased stride length, decreased ankle dorsiflexion- Right, decreased ankle dorsiflexion- Left, and trunk flexed, ambulates in PF on both sides, however R is more flexible than L.   Distance walked: 300' (around gym in session) Assistive device utilized:  mother holding hand to help guide in balance test Level of assistance: SBA and CGA Comments:        Theract/NMR:  Reviewed tasks from HEP during session to ensure safety.  He did sit on large physioball today, bouncing up and down several reps.  Attempted to have him march in place and he did for a few reps, but then got up and back to chair.  Got him into tall kneeling on mat rolling physioball out and back to body.  Again, he did a few reps with [facility] assisting.  Progressed to tall kneeling ball toss x 20 reps.  He stayed in tall kneeling today for up to 1 min today before getting up.  Attempted to have him kick bean bag, however he kept stepping on it, therefore switched to ball kicking to mother during session.  He did do  several reps of this as well.  Recommended they maybe do this to a wall at home so she can guard him.  Ended with having him stand on small trampoline.  He did a few jumps.  Attempted to have him march in place.  He did move legs somewhat but not for any sustained amount of time.     Self care:  Educated that PT did hear back from referring MD about sending referral to [facility].  Mother had  not heard from them yet, so recommended they call and see if they can schedule.Marland Kitchen       PATIENT EDUCATION: Education details:  Updated HEP emailed to mother 10/05/22 Person educated: Patient and Parent Education method: Explanation, Demonstration, and Verbal cues Education comprehension: verbalized understanding, verbal cues required, and needs further education   ACTIVITIES/EXERCISES TO WORK ON CORE STABILIZATION AND BALANCE    1) Use of physioball  - sitting on ball, lift one arm up, then extend one knee; progress to lifting opposite arm and leg;  seated marching on ball - can hold small ball or object in hands, progress to holding object with elbows extended (away from body)   2) Mini trampoline - standing, marching, jumping   3)  Tall kneeling - on both knees; toss/catch a ball in this position (Zoom ball)   4)  Standing on compliant surface (pillows/cushions or soft blanket) for balance; marching on this surface; standing with Eyes closed for 10 secs; practice  head turns side to side 5-10 reps with eyes open and then up/down with eyes open; for more challenge - perform head turns with eyes closed   5) Practice kicking a ball (Soccer) or kicking bean bags   6) Rockerboard or balance board     Games - Zoomball, HyperDash, balance board    Added: Sitting in office chair at, trying to have him "walk" himself forward in chair.   On knees with hands on large physioball, working on rolling ball forward, side to side, alternating lifting arms.     HOME  EXERCISE PROGRAM: See  above    GOALS: Goals reviewed with patient? Yes  SHORT TERM GOALS: Target date: Following third visit   Pt/mother will have initial HEP in order to indicate improved functional mobility and dec fall risk. Baseline: Dependent  Goal status: IN PROGRESS  2.  Pt will demonstrate 5 deg improvement in B ankle DF ROM in order to indicate improved balance.   Baseline: -35 R, -32 L Goal status: NOT MET  3.  Pt will tolerate standing on compliant surfaces x 30 secs in order to indicate improved postural control.  Baseline: Not assessed on eval  Goal status: NOT MET  4.  Pt will improve FGA to >/=25/30 in order to indicate decreased fall risk.  Baseline: 22/30 to 24/30 Goal status: IN PROGRESS  5.  Pt will tolerate standing on incline surfaces x 30 secs in order to indicate improved ankle ROM/tolerance.  Baseline: Not assessed on eval due to time/behavior Goal status: IN PROGRESS    LONG TERM GOALS: Target date:  Following 15th visit   Pt/mother will be IND with final HEP in order to indicate improved functional mobility and dec fall risk.  Baseline: Depedent  Goal status: MET  2.    Pt will improve FGA to >/=28/30 in order to indicate dec fall risk.  Baseline: 22/30 Goal status: NOT MET  3.  Pt will improve B ankle DF ROM by 10 deg from baseline in order to indicate improved function and balance.  Baseline: -35 R, -32 L  Goal status: NOT MET  4.  Pt will tolerate sitting on physioball x 2 mins unsupported in order to indicate improved core strength/postural control.   Baseline: unable to assess on eval  Goal status: IN PROGRESS  5.  Will request Ortho MD consult if pt/mother willing to do so to address structural changes in foot/ankle.  Baseline: Not wanting to pursue at time of eval  Goal status: MET    ASSESSMENT:  CLINICAL IMPRESSION: Skilled session focused on going over remainder of HEP to ensure safe technique.  He did very well today and remained in tall  kneeling for 1 min today.  We are going to Dc at this time but educated mother to return if intervention is performed with ortho MD.    OBJECTIVE IMPAIRMENTS Abnormal gait, decreased balance, decreased cognition, decreased endurance, decreased mobility, decreased ROM, and impaired flexibility.   ACTIVITY LIMITATIONS standing, stairs, and locomotion level  PARTICIPATION LIMITATIONS: community activity  PERSONAL FACTORS Behavior pattern, Time since onset of injury/illness/exacerbation, and 1-2 comorbidities: see above  are also affecting patient's functional outcome.   REHAB POTENTIAL: Fair due to cognitive/behavioral deficits  CLINICAL DECISION MAKING: Evolving/moderate complexity  EVALUATION COMPLEXITY: Moderate  PLAN: PT FREQUENCY: 1x/week then 2x/wk for 6 weeks (if needed)  PT DURATION: 4 weeks then 2x/wk for 6 weeks   PLANNED INTERVENTIONS: Therapeutic exercises, Therapeutic activity, Neuromuscular re-education, Balance training, Gait training, Patient/Family education, Self Care, Joint mobilization, Stair training, Orthotic/Fit training, Aquatic Therapy, and Manual therapy  PLAN FOR NEXT SESSION:    Keon Sprang, PT, MPT [facility] [address] [place], [place], [address] Phone: [phone]   Fax:  [phone] 10/13/22, 1:21 PM

## 2022-10-16 ENCOUNTER — Other Ambulatory Visit (INDEPENDENT_AMBULATORY_CARE_PROVIDER_SITE_OTHER): Payer: Self-pay | Admitting: Pediatrics

## 2022-10-18 ENCOUNTER — Ambulatory Visit: Payer: Medicaid Other | Admitting: Physical Therapy

## 2022-10-25 ENCOUNTER — Ambulatory Visit: Payer: Medicaid Other | Admitting: Rehabilitation

## 2022-10-27 ENCOUNTER — Ambulatory Visit: Payer: Medicaid Other | Admitting: Rehabilitation

## 2022-11-01 ENCOUNTER — Ambulatory Visit: Payer: Medicaid Other | Admitting: Physical Therapy

## 2022-11-03 ENCOUNTER — Ambulatory Visit: Payer: Medicaid Other | Admitting: Physical Therapy

## 2022-11-08 ENCOUNTER — Ambulatory Visit: Payer: Medicaid Other | Admitting: Physical Therapy

## 2022-11-10 ENCOUNTER — Ambulatory Visit: Payer: Medicaid Other | Admitting: Physical Therapy

## 2022-11-15 ENCOUNTER — Ambulatory Visit: Payer: Medicaid Other | Admitting: Physical Therapy

## 2022-11-17 ENCOUNTER — Ambulatory Visit: Payer: Medicaid Other | Admitting: Physical Therapy

## 2022-11-17 ENCOUNTER — Ambulatory Visit (INDEPENDENT_AMBULATORY_CARE_PROVIDER_SITE_OTHER): Payer: Self-pay | Admitting: Pediatrics

## 2022-12-07 NOTE — Progress Notes (Signed)
Patient: Andre Holland MRN: [id number] Sex: male DOB: Jun 10, 2001  Provider: Carylon Perches, MD Location of Care: [facility]  Note type: Routine follow-up  History of Present Illness:  Andre Holland is a 22 y.o. male with history of seizures, autism, insomnia, and tremor  who I am seeing for routine follow-up. Patient was last seen on 08/18/22 where I refilled depakote, ordered labwork, and referred to PT for toe walking.  Since the last appointment, he was able to start with PT, we also completed forms for his dentist to clear him for a surgical procedure.   Patient presents today with his mother who reports that he has been doing well taking the Depakote sprinkles.  They would be interested in getting him to take this in the AM and PM. No seizures since the last visit.   Today, he took his meds at 9 pm last night. However, he woke up at 12 am and was up until 6 am. Was able to sleep for about 4 additional hours. These nights happen occasionally,   He did see the PT, who was interested in him seeing the orthopedist. They are still working on getting him into see this orthopedist. There was difficulty with this as they reported they did not take pediatric patients.  Other Providers: She has not been rescheduled with dentistry but she is working on that. Scheduled to see Dr. Darleene Cleaver in a couple weeks.    Care Management: He has been approved for the 1915 plan and they are working with [facility] to find an activity for him to do during the day.   Past Medical History Past Medical History:  Diagnosis Date   Autism    Seizures [facility])     Surgical History History reviewed. No pertinent surgical history.  Family History family history is not on file.   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    He attended [employer] education Center.    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   They are on a waiting  list for Innovations waiver.    He lives with his mom. He has no siblings.   He enjoys music and watching movies.    Allergies No Known Allergies  Medications Current Outpatient Medications on File Prior to Visit  Medication Sig Dispense Refill   citalopram (CELEXA) 40 MG tablet Take by mouth.     fluticasone (FLONASE) 50 MCG/ACT nasal spray Place 1 spray into both nostrils daily.     guanFACINE (TENEX) 2 MG tablet Take by mouth.     Midazolam (NAYZILAM) 5 MG/0.1ML SOLN Place 5 mg into the nose as needed (for seizure longer than 3 minutes). 2 each 3   Multiple Vitamin (MULTIVITAMIN ADULT PO) Take by mouth.     traZODone (DESYREL) 100 MG tablet TAKE 1/2 TO 1 TABLET BY MOUTH AT BEDTIME FOR SLEEP     No current facility-administered medications on file prior to visit.   The medication list was reviewed and reconciled. All changes or newly prescribed medications were explained.  A complete medication list was provided to the patient/caregiver.  Physical Exam BP 110/84 (BP Location: Left Arm, Patient Position: Sitting, Cuff Size: Normal)   Pulse 96   Wt 217 lb 6.4 oz (98.6 kg)   BMI 27.17 kg/m  Facility age limit for growth %iles is 20 years.  No results found. Gen: well appearing child Skin: No rash, No neurocutaneous  stigmata. HEENT: Normocephalic, no dysmorphic features, no conjunctival injection, nares patent, mucous membranes moist, oropharynx clear. Neck: Supple, no meningismus. No focal tenderness. Resp: Clear to auscultation bilaterally CV: Regular rate, normal S1/S2, no murmurs, no rubs Abd: BS present, abdomen soft, non-tender, non-distended. No hepatosplenomegaly or mass Ext: Warm and well-perfused. No deformities, no muscle wasting, ROM full.  Neurological Examination: MS: Awake, alert, interactive. Poor eye contact, answers pointed questions with 1 word answers, speech was fluent.  Poor attention in room, mostly plays by himself. Cranial Nerves: Pupils were equal and  reactive to light;  EOM normal, no nystagmus; no ptsosis, no double vision, intact facial sensation, face symmetric with full strength of facial muscles, hearing intact grossly.  Motor-Normal tone throughout, Normal strength in all muscle groups. No abnormal movements Reflexes- Reflexes 2+ and symmetric in the biceps, triceps, patellar and achilles tendon. Plantar responses flexor bilaterally, no clonus noted Sensation: Intact to light touch throughout.   Coordination: No dysmetria with reaching for objects    Diagnosis: 1. Caregiver with fatigue   2. Autism spectrum disorder with accompanying language impairment and intellectual disability, requiring substantial support   3. Epilepsy, generalized, convulsive ([place])   4. Toe-walking, habitual      Assessment and Plan Andre Holland is a 22 y.o. male with history of seizures, autism, insomnia, and tremor  who I am seeing in follow-up. Patient continues to be seizure free on current dosage. In addition he is doing well with transition to Depakote sprinkles, will transition to sprinkles in AM and PM. I am glad he is able to make this transition as this will keep his levels of medication more consistent. I would still however like to obtain labwork to assess the level in his blood. To address toe walking, advised mom look for shoes with a firm bottom. Recommend he continue with PT, will hold off on referral for orthopedist for now as mom does not think he would behaviorally be able to manage casting or other interventions. I also discussed caregiver burden with mom today, will refer to IBH to support mom further.   - Continue Depakote, switched to sprinkles in AM and PM  - Lab work ordered at last visit  - [facility] to [facility]   Return in about 3 months (around 03/16/2023).  I, Scharlene Gloss, scribed for and in the presence of Carylon Perches, MD at today's visit on 12/15/2022.   I, Carylon Perches MD MPH, personally performed the services described in  this documentation, as scribed by Scharlene Gloss in my presence on 12/15/2022 and it is accurate, complete, and reviewed by me.    Carylon Perches MD MPH [facility]  [address], [place], [place] [address] Phone: [phone] Fax: [phone]

## 2022-12-15 ENCOUNTER — Other Ambulatory Visit (INDEPENDENT_AMBULATORY_CARE_PROVIDER_SITE_OTHER): Payer: Self-pay | Admitting: Pediatrics

## 2022-12-15 ENCOUNTER — Ambulatory Visit (INDEPENDENT_AMBULATORY_CARE_PROVIDER_SITE_OTHER): Payer: Medicaid Other | Admitting: Pediatrics

## 2022-12-15 ENCOUNTER — Encounter (INDEPENDENT_AMBULATORY_CARE_PROVIDER_SITE_OTHER): Payer: Self-pay | Admitting: Pediatrics

## 2022-12-15 VITALS — BP 110/84 | HR 96 | Wt 217.4 lb

## 2022-12-15 DIAGNOSIS — R5383 Other fatigue: Secondary | ICD-10-CM

## 2022-12-15 DIAGNOSIS — G40309 Generalized idiopathic epilepsy and epileptic syndromes, not intractable, without status epilepticus: Secondary | ICD-10-CM | POA: Diagnosis not present

## 2022-12-15 DIAGNOSIS — F840 Autistic disorder: Secondary | ICD-10-CM | POA: Diagnosis not present

## 2022-12-15 DIAGNOSIS — Z636 Dependent relative needing care at home: Secondary | ICD-10-CM

## 2022-12-15 DIAGNOSIS — R2689 Other abnormalities of gait and mobility: Secondary | ICD-10-CM | POA: Diagnosis not present

## 2022-12-15 MED ORDER — DIVALPROEX SODIUM 125 MG PO CSDR: DELAYED_RELEASE_CAPSULE | ORAL | 2 refills | Status: DC

## 2022-12-15 NOTE — Patient Instructions (Addendum)
I changed the morning and night dose to Depakote sprinkles, this will be 4 sprinkles in the morning and 8 at night.  I recommend looking for shoes with a firm bottom to help with toe walking.  I placed a referral to our integrated behavioral health counselor for Andre Holland and for you.  For Labs : Ideally, I would like this to be done in the morning when he would normally get his medications. You can come to my office on any Thursday.

## 2022-12-15 NOTE — Telephone Encounter (Signed)
Andre Holland, please call the pharmacy and let them know to put a 2 in the submission clarification field for quantity override. Thanks, [name]

## 2023-01-07 IMAGING — CT CT HEAD W/O CM
6 of 10 series · 14 of 47 positions shown, 15 images · non-contrast
Comparison: None.

CLINICAL DATA: Head trauma. The patient's mother found the patient
with head on toilet as if he had been vomiting. Abrasions to face
and arm. History of seizures. Patient is autistic.

EXAM:
CT HEAD WITHOUT CONTRAST
TECHNIQUE: Contiguous axial images were obtained from the base of the skull
through the vertex without intravenous contrast.

[Series 3: head bone · axial · 0.46mm/px · z∈[-525,-505]mm · 2 of 88 slices shown]
[im 10/88  bone]
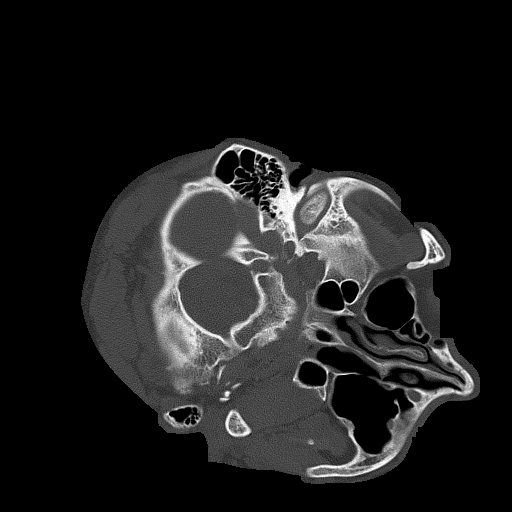
[im 20/88  bone]
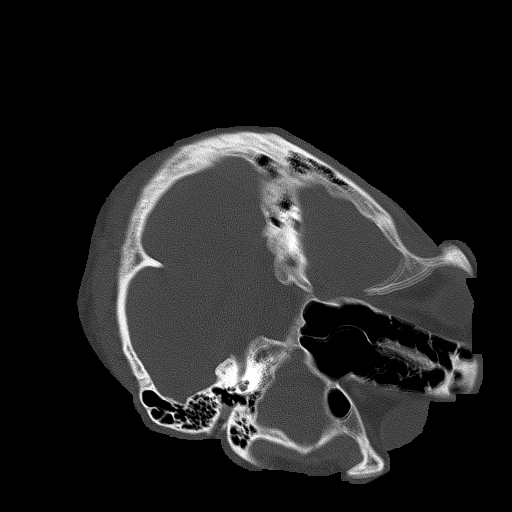

[Series 4: head without · axial · non-contrast · 0.46mm/px · z∈[-488,-428]mm · 2 of 36 slices shown, 3 images]
[im 12/36  brain]
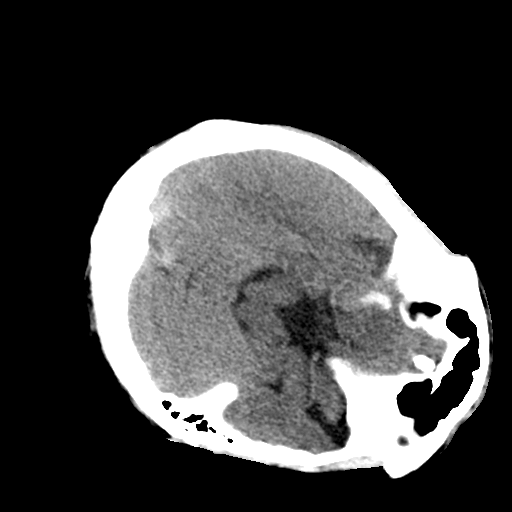
[im 12/36  bone]
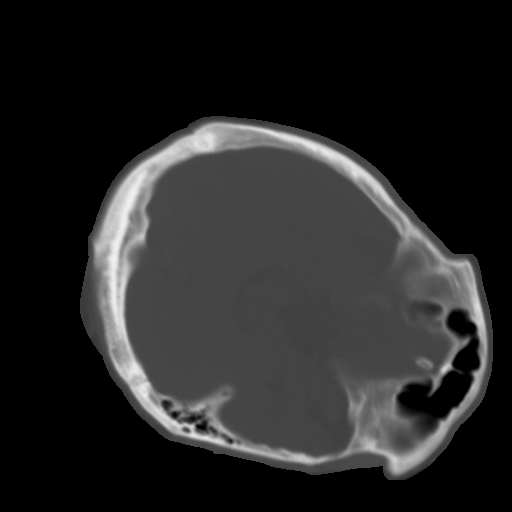
[im 24/36  brain]
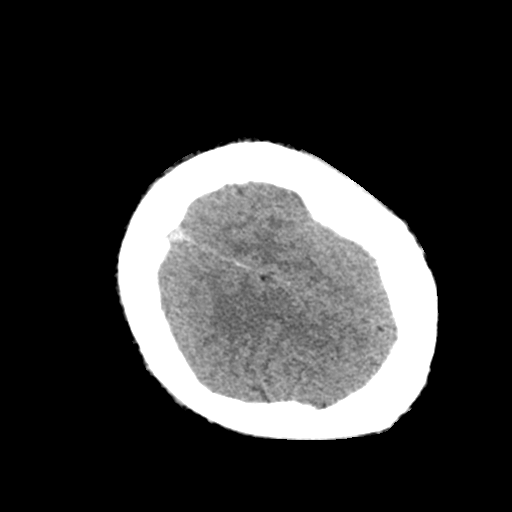

[Series 8: head without sag · coronal · non-contrast · 0.28mm/px · 3 of 75 slices shown]
[im 16/75  brain]
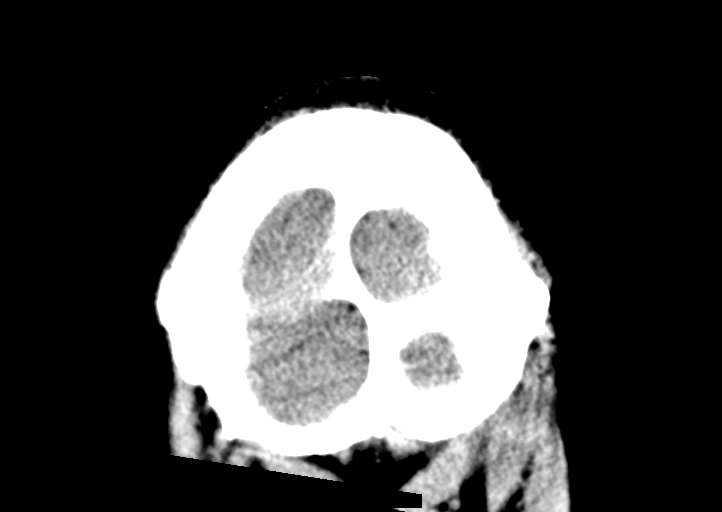
[im 32/75  brain]
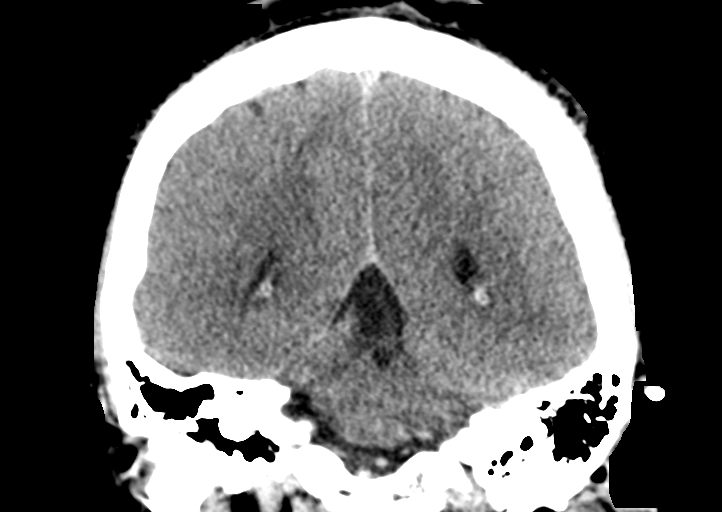
[im 48/75  brain]
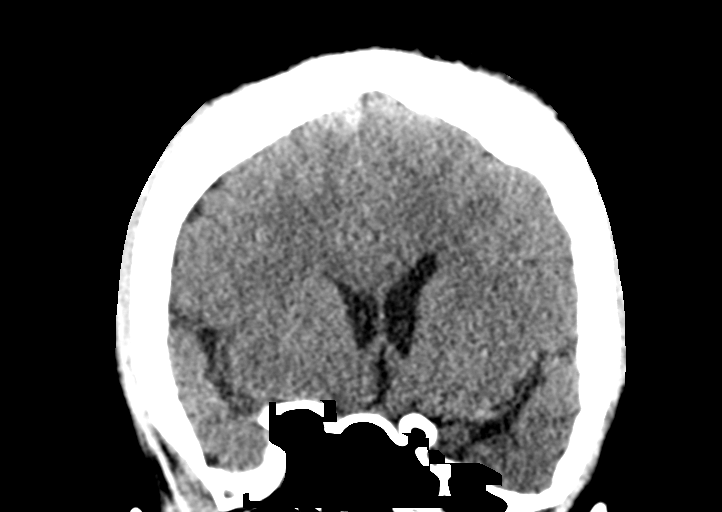

[Series 10: head without cor · sagittal · non-contrast · 0.19mm/px · 2 of 67 slices shown]
[im 23/67  brain]
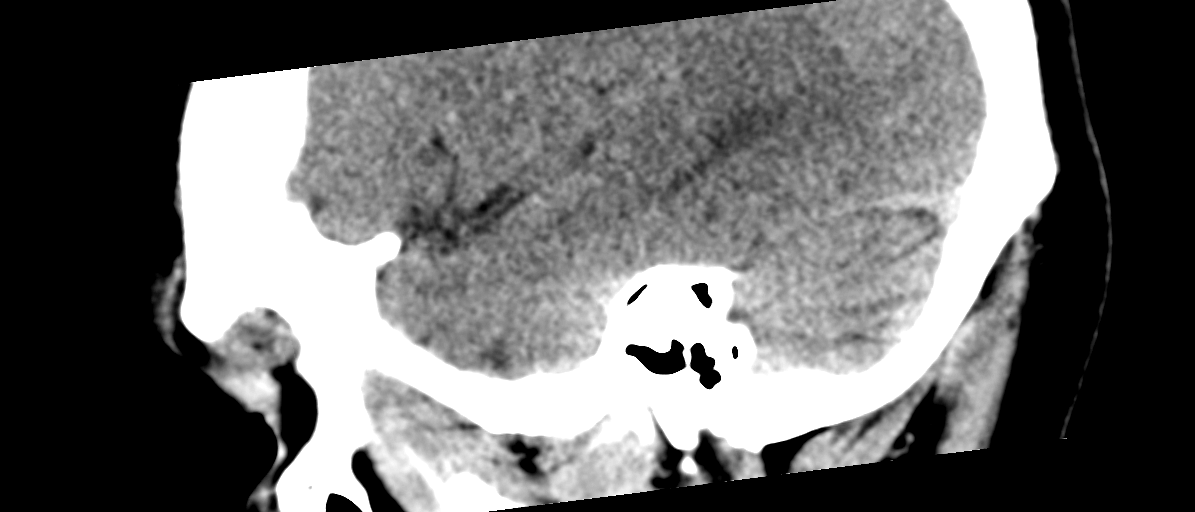
[im 45/67  brain]
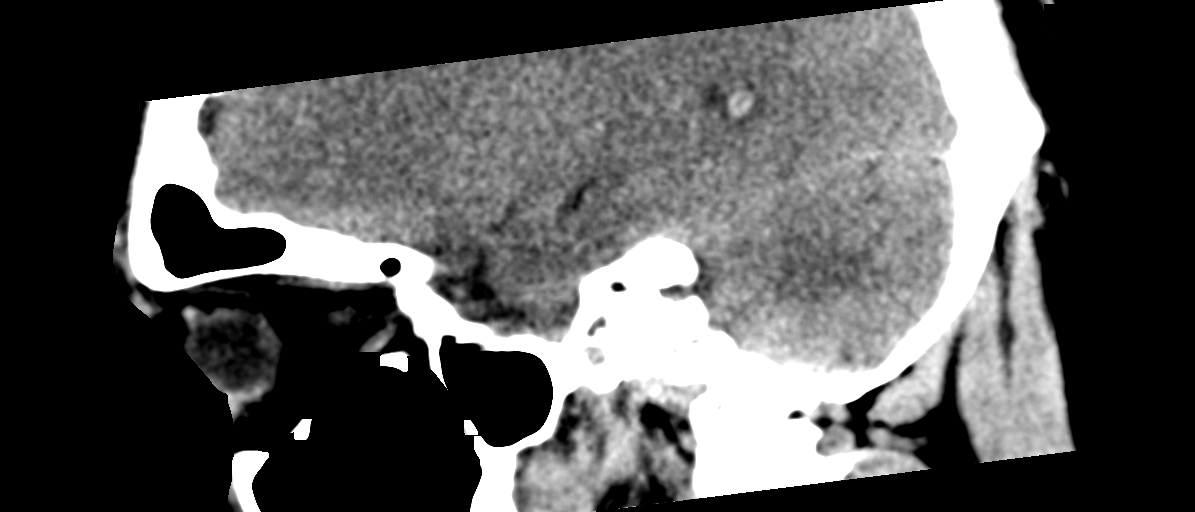

[Series 11: head without ax · axial · non-contrast · 0.38mm/px · z∈[-478,-414]mm · 3 of 45 slices shown (1 of 2)]
[im 12/45  brain]
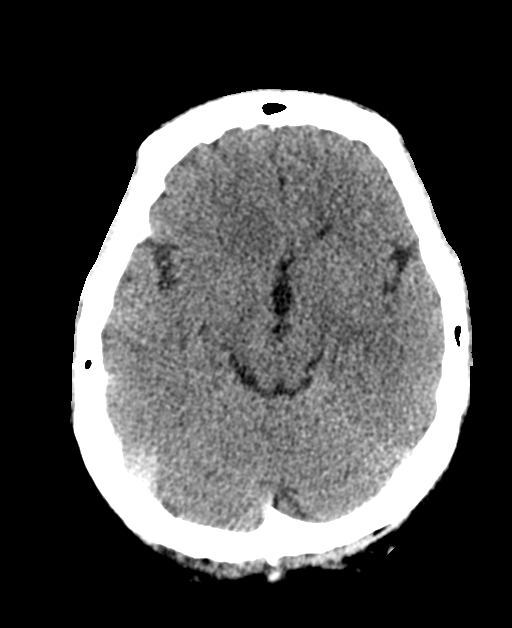
[im 23/45  brain]
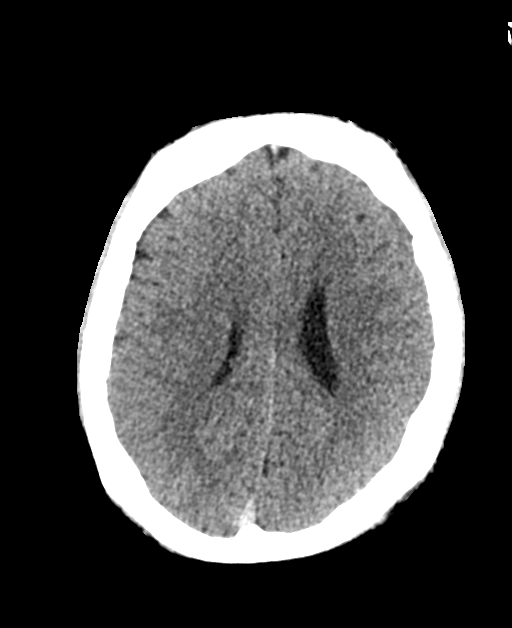
[im 34/45  brain]
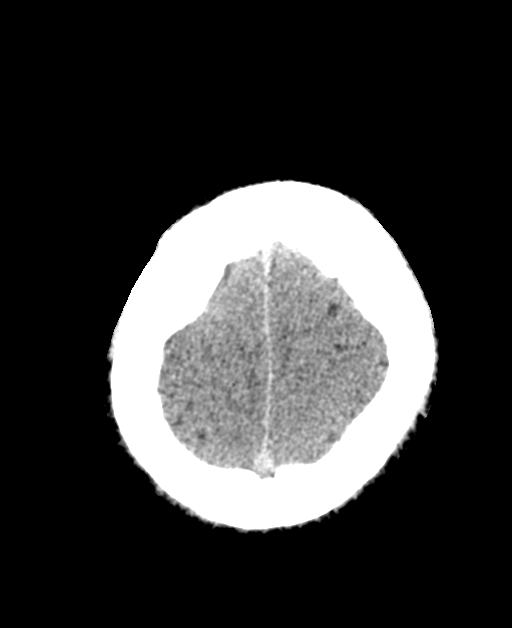

[Series 12: head without ax · axial · non-contrast · 0.38mm/px · z∈[-491,-461]mm · 2 of 31 slices shown (2 of 2)]
[im 11/31  brain]
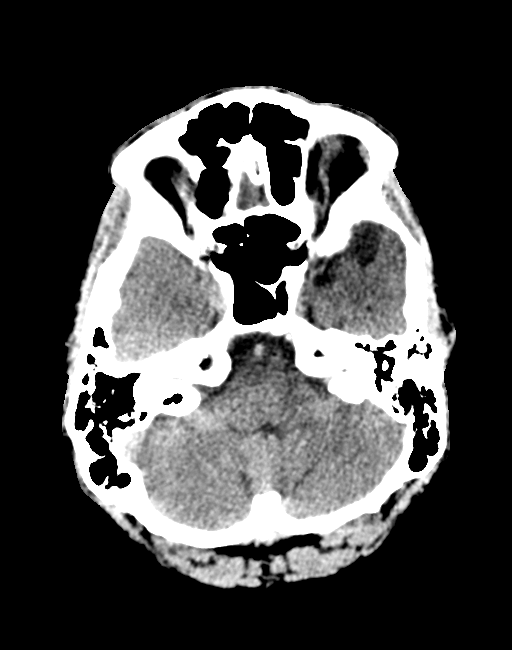
[im 21/31  brain]
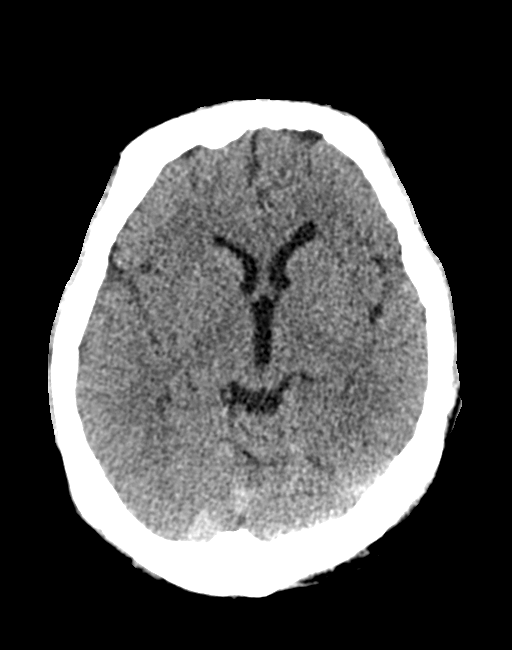

[14 of 47 positions shown; findings below may reference images not displayed]

FINDINGS: Brain: No subdural, epidural, or subarachnoid hemorrhage. Ventricles
and sulci are normal for age. Cerebellum, brainstem, and basal
cisterns are unremarkable. No mass effect or midline shift. No acute
cortical ischemia or infarct.

Vascular: No hyperdense vessel or unexpected calcification.

Skull: Normal. Negative for fracture or focal lesion.

Sinuses/Orbits: No acute finding.

Other: None.
IMPRESSION: No acute intracranial abnormalities are identified.

## 2023-01-20 ENCOUNTER — Encounter (INDEPENDENT_AMBULATORY_CARE_PROVIDER_SITE_OTHER): Payer: Self-pay

## 2023-02-17 LAB — CBC WITH DIFFERENTIAL/PLATELET
Absolute Monocytes: 502 cells/uL (ref 200–950)
Basophils Absolute: 33 cells/uL (ref 0–200)
Basophils Relative: 0.5 %
Eosinophils Absolute: 53 cells/uL (ref 15–500)
Eosinophils Relative: 0.8 %
HCT: 50.3 % — ABNORMAL HIGH (ref 38.5–50.0)
Hemoglobin: 16.9 g/dL (ref 13.2–17.1)
Lymphs Abs: 2072 cells/uL (ref 850–3900)
MCH: 30.3 pg (ref 27.0–33.0)
MCHC: 33.6 g/dL (ref 32.0–36.0)
MCV: 90.1 fL (ref 80.0–100.0)
MPV: 12.7 fL — ABNORMAL HIGH (ref 7.5–12.5)
Monocytes Relative: 7.6 %
Neutro Abs: 3940 cells/uL (ref 1500–7800)
Neutrophils Relative %: 59.7 %
Platelets: 200 10*3/uL (ref 140–400)
RBC: 5.58 10*6/uL (ref 4.20–5.80)
RDW: 12.9 % (ref 11.0–15.0)
Total Lymphocyte: 31.4 %
WBC: 6.6 10*3/uL (ref 3.8–10.8)

## 2023-02-17 LAB — COMPREHENSIVE METABOLIC PANEL
AG Ratio: 1.6 (calc) (ref 1.0–2.5)
ALT: 23 U/L (ref 9–46)
AST: 23 U/L (ref 10–40)
Albumin: 4.5 g/dL (ref 3.6–5.1)
Alkaline phosphatase (APISO): 109 U/L (ref 36–130)
BUN: 13 mg/dL (ref 7–25)
CO2: 18 mmol/L — ABNORMAL LOW (ref 20–32)
Calcium: 9.6 mg/dL (ref 8.6–10.3)
Chloride: 105 mmol/L (ref 98–110)
Creat: 1.09 mg/dL (ref 0.60–1.24)
Globulin: 2.8 g/dL (calc) (ref 1.9–3.7)
Glucose, Bld: 173 mg/dL — ABNORMAL HIGH (ref 65–139)
Potassium: 4.3 mmol/L (ref 3.5–5.3)
Sodium: 138 mmol/L (ref 135–146)
Total Bilirubin: 0.6 mg/dL (ref 0.2–1.2)
Total Protein: 7.3 g/dL (ref 6.1–8.1)

## 2023-02-17 LAB — VALPROIC ACID LEVEL: Valproic Acid Lvl: 56.9 mg/L (ref 50.0–100.0)

## 2023-02-17 LAB — AMMONIA: Ammonia: 81 umol/L — ABNORMAL HIGH (ref <=72)

## 2023-02-17 LAB — VITAMIN D 25 HYDROXY (VIT D DEFICIENCY, FRACTURES): Vit D, 25-Hydroxy: 17 ng/mL — ABNORMAL LOW (ref 30–100)

## 2023-02-27 ENCOUNTER — Telehealth (INDEPENDENT_AMBULATORY_CARE_PROVIDER_SITE_OTHER): Payer: Self-pay | Admitting: Pediatrics

## 2023-02-27 NOTE — Telephone Encounter (Signed)
Received email from mom:   Dear Normand Sloop & Dr. Rogers Blocker,  Attached please find the physical form requested by [facility] to facilitate Andre Holland's annual dental cleanings, etc. The form is identical to the documents that Dr. Gaynell Face has prepared for the last several years. I would like to ask you to complete the document this year. We are anticipating basic dental services - cleaning, X-rays.  I also believe that [facility] records as well.  His next scheduled appointment with you is slated for April 18th and the dental work is scheduled for April 25th.  Dr. Tamala Fothergill is Andre Holland's dentist.  Please fax the completed forms to Dr. Cora Collum office at [phone].  Many thanks and I appreciate your time (again). Please call with questions at [phone]. And if I may impose upon you, could you let me know when the document is sent to Dr. Gayland Curry?  Best Regards,  Everette Rank

## 2023-02-28 NOTE — Telephone Encounter (Signed)
Form completed and faxed to [facility] at [phone].   Following email sent to mom:    Good Afternoon Katerine,   I wanted to let you know that the completed form and most recent office note from 12/15/22 was sent over to the [facility]. I will also send over the next office note once we have the visit!   Let me know if they need anything else from [place]!   Best,  Normand Sloop

## 2023-03-07 ENCOUNTER — Other Ambulatory Visit (INDEPENDENT_AMBULATORY_CARE_PROVIDER_SITE_OTHER): Payer: Self-pay | Admitting: Pediatrics

## 2023-03-07 NOTE — Telephone Encounter (Signed)
Attempted to contact mom the verify which medication she would like sent to the new pharmacy. Mom was unable to be reached. LVM to call back.   SS, CCMA

## 2023-03-07 NOTE — Telephone Encounter (Signed)
Received the following email from mom:   Hi Ellie,  I hope all is well. It's your "favorite " pest - Kaydon's mom, Georgiann Hahn.  During our recent visit with you and Dr. Artis Flock , cam's prescription renewals were sent to [facility] but we are now using [facility] [address] since Cam qualified for Medicaid.  Could I again impose upon you to [facility]? We have an appointment in a bit so if we need to wait, I understand.  Sorry we are soooooo needy.  Take care, Iver Nestle

## 2023-03-09 ENCOUNTER — Other Ambulatory Visit (INDEPENDENT_AMBULATORY_CARE_PROVIDER_SITE_OTHER): Payer: Self-pay | Admitting: Pediatrics

## 2023-03-09 MED ORDER — DIVALPROEX SODIUM 125 MG PO CSDR: DELAYED_RELEASE_CAPSULE | ORAL | 2 refills | Status: DC

## 2023-03-15 NOTE — Progress Notes (Signed)
Patient: Andre Holland MRN: [id number] Sex: male DOB: 08-04-2001  Provider: Lorenz Coaster, MD Location of Care: [facility]  Note type: Routine follow-up  History of Present Illness:  Andre Holland is a 22 y.o. male with history of seizures, autism, insomnia, and tremor who I am seeing for routine follow-up. Patient was last seen on 12/15/22 where I continued Depakote and referred to [facility].  Since the last appointment, we have completed paperwork for him to get dental work completed at [facility].  Patient presents today with mom.   She reports on March 22, he had another seizure lasting 1-2 minutes.  Typical seizure, late in the morning so after he had his medications.  No missed doses, no illness or poor sleep.    Dental surgery scheduled Thursday.  They are asking for a letter noting if I have any concerns.    Saw Dr Jannifer Franklin since last appointment, no problems.  Mom hasn't followed up with IBH. Ready to make appointment    Sleep still erratic, still having difficult falling asleep but then difficulty waking up.  Has never tried melatonin.    Still working on [employer]. Still waiting for 1915 services to start.  Shelly Coss has been difficult, but told there will be an action plan this week.    Past Medical History Past Medical History:  Diagnosis Date   Autism    Seizures     Surgical History History reviewed. No pertinent surgical history.  Family History family history is not on file.   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    Edel has Tree surgeon.   They are on a waiting list for Innovations waiver. Has been working with Koren Shiver to receive services through the 19-15I plan.   He lives with his mom. He has no siblings.   He enjoys music and watching movies.    Allergies No Known  Allergies  Medications Current Outpatient Medications on File Prior to Visit  Medication Sig Dispense Refill   citalopram (CELEXA) 40 MG tablet Take by mouth.     fluticasone (FLONASE) 50 MCG/ACT nasal spray Place 1 spray into both nostrils daily.     guanFACINE (TENEX) 2 MG tablet Take by mouth.     Multiple Vitamin (MULTIVITAMIN ADULT PO) Take by mouth.     traZODone (DESYREL) 100 MG tablet TAKE 1/2 TO 1 TABLET BY MOUTH AT BEDTIME FOR SLEEP     Midazolam (NAYZILAM) 5 MG/0.1ML SOLN Place 5 mg into the nose as needed (for seizure longer than 3 minutes). 2 each 3   No current facility-administered medications on file prior to visit.   The medication list was reviewed and reconciled. All changes or newly prescribed medications were explained.  A complete medication list was provided to the patient/caregiver.  Physical Exam BP (!) 144/90 (BP Location: Left Arm, Patient Position: Sitting, Cuff Size: Large) Comment: Taken twice, Cuff size increase. Comment (Cuff Size): Increased from Normal  Pulse (!) 102   Wt 224 lb 9.6 oz (101.9 kg)   BMI 28.07 kg/m  Facility age limit for growth %iles is 20 years.  No results found. General: NAD, well nourished.  Limited speech, poor eye contact. Follows simple commands.  HEENT: normocephalic, no eye or nose discharge.  MMM  Cardiovascular: warm and well perfused Lungs: Normal work of breathing, no rhonchi or  stridor Skin: No birthmarks, no skin breakdown Abdomen: soft, non tender, non distended Extremities: No contractures or edema. Neuro: EOM intact, face symmetric. Moves all extremities equally and at least antigravity. Habitual toe walking.  Decreased ROM in bilateral ankles. No abnormal movements.     Diagnosis: 1. Toe-walking, habitual   2. Autism spectrum disorder with accompanying language impairment and intellectual disability, requiring substantial support   3. Caregiver with fatigue   4. Epilepsy, generalized, convulsive   5. Insomnia,  unspecified type      Assessment and Plan Andre Holland is a 22 y.o. male with history of seizures, autism, insomnia, and tremor who I am seeing in follow-up. Given reports of breakthrough seizures with no triggers, likely needs higher dose of AED. Increase  Depakote to  6 tablets in the morning and 8 tablets at night.  Plan to continue to go up on dose if he has further seizures. I am hopeful that increasing the night dose of the medication may help him fall asleep at night as well. To further assist with sleep, recommended mom start melatonin as well as continue his Trazodone. Recommend she start by giving 3 mg of melatonin with plan to increase up to 12 mg PRN to help him fall asleep. To evaluate for spasticity in his ankles and for equipment, referred to orthopedics today. I have no concerns regarding sedation for dentistry. Medications were increased today, but that has no effect on his clearance.  I also discussed caregiver burden with mom today, recommend scheduling with IBH to support mom further.   - Increase Depakote  - Start melatonin, continue trazodone - Refer to orthopedics - Cleared for sedation dentistry    - Recommend scheduling with IBH   I spent 30 minutes on day of service on this patient including review of chart, discussion with patient and family, discussion of screening results, coordination with other providers and management of orders and paperwork.     Return in about 3 months (around 06/19/2023).  Lorenz Coaster MD MPH Neurology and Neurodevelopment [facility] Neurology  [address] [place], [place], [place] [address] Phone: [phone] Fax: [phone]

## 2023-03-20 ENCOUNTER — Encounter (INDEPENDENT_AMBULATORY_CARE_PROVIDER_SITE_OTHER): Payer: Self-pay | Admitting: Pediatrics

## 2023-03-20 ENCOUNTER — Ambulatory Visit (INDEPENDENT_AMBULATORY_CARE_PROVIDER_SITE_OTHER): Payer: Medicaid Other | Admitting: Pediatrics

## 2023-03-20 VITALS — BP 144/90 | HR 102 | Wt 224.6 lb

## 2023-03-20 DIAGNOSIS — G40309 Generalized idiopathic epilepsy and epileptic syndromes, not intractable, without status epilepticus: Secondary | ICD-10-CM | POA: Diagnosis not present

## 2023-03-20 DIAGNOSIS — R2689 Other abnormalities of gait and mobility: Secondary | ICD-10-CM

## 2023-03-20 DIAGNOSIS — G4700 Insomnia, unspecified: Secondary | ICD-10-CM

## 2023-03-20 DIAGNOSIS — F840 Autistic disorder: Secondary | ICD-10-CM

## 2023-03-20 DIAGNOSIS — R5383 Other fatigue: Secondary | ICD-10-CM

## 2023-03-20 MED ORDER — DIVALPROEX SODIUM 125 MG PO CSDR: DELAYED_RELEASE_CAPSULE | ORAL | 2 refills | Status: DC

## 2023-03-20 MED ORDER — MELATONIN 3 MG PO CAPS: ORAL_CAPSULE | ORAL | 0 refills | Status: AC

## 2023-03-20 NOTE — Patient Instructions (Addendum)
Increase Depakote to 6 tablets in the morning and 8 tablets at night.  I have sent a new prescription so when your current prescription runs out, make sure they fill this new one.    I sent a new referral to ADULT orthopedics.  My nurse will call this time to make sure it goes through and if not, will send to [facility].   Start Melatonin  at 9pm with a goal of making him fall asleep easier, and wake up easier.  The trazodone will still be to help him stay asleep.  If this doesn't work, can increase to dose of . Once it works, can move it up to earlier.   I have no concerns regarding sedation for dentistry.  Thank you for your assistance in ensuring Andre Holland's dental health. Medications were increased today, but that has no effect on his clearance.

## 2023-03-23 NOTE — BH Specialist Note (Addendum)
Integrated Behavioral Health via Telemedicine Visit  03/23/2023 Andre Holland [id number]  Number of Integrated Behavioral Health Clinician visits: Initial Visit (1/6) Session Start time: 930 Session End time: 1005 Total time in minutes: 36  Referring Provider: Dr. Artis Flock for caregiver fatigue Patient/Family location: [address], [place], [place] [address] [facility] Provider location: Pediatric Specialties at [facility] All persons participating in visit: Patients mother Types of Service: Family psychotherapy-video visit  I connected with Malena Edman Market's mother via  Telephone or Video Enabled Telemedicine Application  (Video is Caregility application) and verified that I am speaking with the correct person using two identifiers. Discussed confidentiality: Yes   I discussed the limitations of telemedicine and the availability of in person appointments.  Discussed there is a possibility of technology failure and discussed alternative modes of communication if that failure occurs.  I discussed that engaging in this telemedicine visit, they consent to the provision of behavioral healthcare and the services will be billed under their insurance.  Patient and/or legal guardian expressed understanding and consented to Telemedicine visit: Yes   Presenting Concerns: Patient and/or family reports the following symptoms/concerns:   -Patients mother reports stress associated with prolonged wait times for Medicaid benefits to be set up after policy changes. -This has caused patient and family to lose benefits that had positive impacts on the quality of life of patient and patients mother, whom is the main caretaker. -The benefits helped with patient being set up with the [employer] that allowed him to be involved in multiple activities, have social interactions, and have the opportunity to have respite. -Patients mother reports patients 1915 plan was approved in February but has not been set  up.  -Patients mother report the family had a home visit two weeks ago but have not heard back. - Patients mother reports this has been mentally draining, further report the family is having to "hurry up and wait." -Patients mother report an added stressor of patients father leaving the family at the beginning of the year.  Duration of problem: months; Severity of problem: moderate  Patient and/or Family's Strengths/Protective Factors: Concrete supports in place (healthy food, safe environments, etc.), Caregiver has knowledge of parenting & child development, Parental Resilience, and patient resilience   Goals Addressed: Patient will:  Reduce symptoms of: stress   Increase knowledge and/or ability of: coping skills, healthy habits, and stress reduction   Demonstrate ability to: Increase healthy adjustment to current life circumstances and Increase adequate support systems for patient/family  Progress towards Goals: Ongoing  Interventions: Interventions utilized:  Motivational Interviewing, Solution-Focused Strategies, Supportive Counseling, and Psychoeducation and/or Health Education Standardized Assessments completed: Not Needed  Patient and/or Family Response: Patients mother receptive and open to clinician assessment and recommendation. Patients mother reports the family would benefit for an additional advocate to "nudge" [place] to help get benefits started again. Patients mother reports she will see how IBH sessions go for support for the family.  Assessment: Patient family currently experiencing stress associated with change in Medicaid policy causing a shift in benefits. Patient has lost previous social interactions and support due to his shift as these benefits were covered. This has caused additional stress on patient and family.   Patient may benefit from assistance with contacting [facility] and continued family support to help learn stress management skills while benefits  get set up.   Plan: Follow up with behavioral health clinician on : May 17th Behavioral recommendations: see goals addressed and patient centered plan  Referral(s): Integrated Behavioral Health  Services (In Clinic)   I discussed the assessment and treatment plan with the patient and/or parent/guardian. They were provided an opportunity to ask questions and all were answered. They agreed with the plan and demonstrated an understanding of the instructions.   They were advised to call back or seek an in-person evaluation if the symptoms worsen or if the condition fails to improve as anticipated.  Jill Side, LCSW

## 2023-03-24 ENCOUNTER — Ambulatory Visit (INDEPENDENT_AMBULATORY_CARE_PROVIDER_SITE_OTHER): Payer: Medicaid Other | Admitting: Licensed Clinical Social Worker

## 2023-03-24 DIAGNOSIS — F840 Autistic disorder: Secondary | ICD-10-CM | POA: Diagnosis not present

## 2023-04-02 ENCOUNTER — Encounter (INDEPENDENT_AMBULATORY_CARE_PROVIDER_SITE_OTHER): Payer: Self-pay | Admitting: Pediatrics

## 2023-04-03 ENCOUNTER — Encounter (INDEPENDENT_AMBULATORY_CARE_PROVIDER_SITE_OTHER): Payer: Self-pay

## 2023-04-13 NOTE — BH Specialist Note (Addendum)
Integrated Behavioral Health via Telemedicine Visit  04/13/2023 ABUBAKR WIEMAN [id number]  Number of Integrated Behavioral Health Clinician visits: Second Visit (2/6)  Session Start time:  932 Session End time: 1004 Total time in minutes: 32 min  Referring Provider: Dr. Artis Flock for caregiver fatigue Patient/Family location: [address]  [facility] [place] [employer] [facility] [id number]  [facility] Provider location:  Child Neurology All persons participating in visit: Patients mother- Layton Naves Types of Service: Family psychotherapy- Video Visit  I connected with Clovia Cuff and/or Malena Edman Stanczak's mother via  Telephone or Video Enabled Telemedicine Application  (Video is Caregility application) and verified that I am speaking with the correct person using two identifiers. Discussed confidentiality: Yes   I discussed the limitations of telemedicine and the availability of in person appointments.  Discussed there is a possibility of technology failure and discussed alternative modes of communication if that failure occurs.  I discussed that engaging in this telemedicine visit, they consent to the provision of behavioral healthcare and the services will be billed under their insurance.  Patient and/or legal guardian expressed understanding and consented to Telemedicine visit: Yes   Presenting Concerns: Patient and/or family reports the following symptoms/concerns:   -Patients mother reports the family got approval of 1915 plan in December not February, further reporting they have been waiting for services to get set up for quite some time. -Family reports they have heard a little more consistently for their IT consultant but communication still takes a while.  -From mothers understanding, Shelly Coss should be working on getting patient set back up at the [employer]. The lack of social interactions continues to be a challenge to both patient and  mother mental health. -Patients mother notices patients signs of frustration when he has few social interactions and is not getting out of the home as much.   Duration of problem: months; Severity of problem: moderate  Patient and/or Family's Strengths/Protective Factors: Concrete supports in place (healthy food, safe environments, etc.), Caregiver has knowledge of parenting & child development, Parental Resilience, and Patient resilience  Goals Addressed: Patient will:  Reduce symptoms of: stress   Increase knowledge and/or ability of: coping skills, healthy habits, and stress reduction   Demonstrate ability to: Increase healthy adjustment to current life circumstances and Increase adequate support systems for patient/family  Progress towards Goals: Ongoing  Interventions: Interventions utilized:  Motivational Interviewing, Solution-Focused Strategies, Psychoeducation and/or Health Education, and Supportive Reflection Standardized Assessments completed: Not Needed  Patient and/or Family Response: Patients mother receptive to clinician assessment and recommendations. Patients mother report she has been more intentional with practicing gratitude and being more focused on the good. Family will usually change scenery in the house when it is raining and can not get out of the house but when the weather is good, they go outside and run errands to familiar places.   Assessment: Patient family currently experiencing stress associated with change in Medicaid policy causing a shift in benefits. Patient has lost previous social interactions and support due to his shift as these benefits were covered. This has caused additional stress on patient and family.   Patient may benefit from continued support wit contacting [facility] and support group and help with finding therapeutic services for patients mother.  Plan: Follow up with behavioral health clinician on : 05/05/23 Behavioral recommendations:  see goals addressed and patient centered plan Referral(s): Integrated [employer] (In Clinic)  I discussed the assessment and treatment plan with the patient and/or parent/guardian. They were provided  an opportunity to ask questions and all were answered. They agreed with the plan and demonstrated an understanding of the instructions.   They were advised to call back or seek an in-person evaluation if the symptoms worsen or if the condition fails to improve as anticipated.  Jill Side, LCSW

## 2023-04-14 ENCOUNTER — Ambulatory Visit (INDEPENDENT_AMBULATORY_CARE_PROVIDER_SITE_OTHER): Payer: Medicaid Other | Admitting: Licensed Clinical Social Worker

## 2023-04-14 DIAGNOSIS — F840 Autistic disorder: Secondary | ICD-10-CM

## 2023-05-04 NOTE — BH Specialist Note (Addendum)
Integrated Behavioral Health via Telemedicine Visit  05/04/2023 CAMEN KETCH [id number]  Number of Integrated Behavioral Health Clinician visits: Third visit (3/6)  Session Start time: 9:30 am   Session End time: 9:56 am  Total time in minutes: 26 minutes   Referring Provider:  Dr. Artis Flock for caregiver fatigue Patient/Family location: [address]  [facility] [place] [address]  [facility] Provider location: Pediatric Child Neurology All persons participating in visit:  mom Types of Service: Family psychotherapy and Video Visit  I connected with Clovia Cuff and/or Malena Edman Axton's mother via Video Enabled Telemedicine Application  (Video is Caregility application) and verified that I am speaking with the correct person using two identifiers. Discussed confidentiality: Yes   I discussed the limitations of telemedicine and the availability of in person appointments.  Discussed there is a possibility of technology failure and discussed alternative modes of communication if that failure occurs.  I discussed that engaging in this telemedicine visit, they consent to the provision of behavioral healthcare and the services will be billed under their insurance.  Patient and/or legal guardian expressed understanding and consented to Telemedicine visit: Yes    Subjective: - Patient's mother reports patient's care plan was finalized this past Monday by his care manager.  -Patient mother reports the care manager submitted a referral for the Autism Society but was denied due to stating they needed to do another evaluation because the diagnosis of Autism was not stated on paperwork initially sent in. Patients mother reports printing patients last visit summary by Dr. Artis Flock to help with verifying diagnosis. -Patients mother reports the family has been emotionally and mentally doing better, further reporting being in a "steady state" and have been trying to do some of the same  activities that keep them active, especially with the warmer weather.  Duration of problem: months; Severity of problem: moderate // Currently: mild   Patient and/or Family's Strengths/Protective Factors: Concrete supports in place (healthy food, safe environments, etc.), Caregiver has knowledge of parenting & child development, Parental Resilience, and Patient resilience.   Goals Addressed: Patient will:  Reduce symptoms of: stress   Increase knowledge and/or ability of: coping skills, healthy habits, and stress reduction   Demonstrate ability to: Increase healthy adjustment to current life circumstances and Increase adequate support systems for patient/family  Progress towards Goals: Ongoing  Interventions: Interventions utilized:  Motivational Interviewing, Psychoeducation and/or Health Education, Link to [employer], and Supportive Reflection Standardized Assessments completed: Not Needed  Provided community resources for patients mother, including longer term therapy options, as well as event calendar for free events hosted by [employer].  Patient and/or Family Response: Patients mother receptive to clinician assessment and recommendations. Patients mother open to clinician sending resources for longer term mental health therapy to begin processing significant life changes such as patients dad leaving the family. Patients mother reports willingness to check out upcoming free social events hosted by the autism society in [place] and [place] later this month.   Assessment: Patient family currently experiencing stress associated with change in Medicaid policy causing a shift in benefits. Patient has lost previous social interactions and support due to his shift as these benefits were covered. This has caused additional stress on patient and family.   Patient may benefit from continued support with contacting [facility] and support group and help with finding therapeutic  services for patients mother.  Plan: Follow up with behavioral health clinician on : Patients mother will call the office in early October to set  up an appointment for when clinician returns from maternity leave.  Behavioral recommendations: see goals addressed and patient centered plan Referral(s): Integrated [employer] (In Clinic)  I discussed the assessment and treatment plan with the patient and/or parent/guardian. They were provided an opportunity to ask questions and all were answered. They agreed with the plan and demonstrated an understanding of the instructions.   They were advised to call back or seek an in-person evaluation if the symptoms worsen or if the condition fails to improve as anticipated.  Jill Side, LCSW

## 2023-05-05 ENCOUNTER — Ambulatory Visit (INDEPENDENT_AMBULATORY_CARE_PROVIDER_SITE_OTHER): Payer: Medicaid Other | Admitting: Licensed Clinical Social Worker

## 2023-05-05 ENCOUNTER — Encounter (INDEPENDENT_AMBULATORY_CARE_PROVIDER_SITE_OTHER): Payer: Self-pay

## 2023-05-05 DIAGNOSIS — F840 Autistic disorder: Secondary | ICD-10-CM | POA: Diagnosis not present

## 2023-05-05 NOTE — Patient Instructions (Addendum)
                              Autism Society event calendar: [email or web address]  Therapy Options:  [facility]: [email or web address]  [employer]: [email or web address]

## 2023-06-19 ENCOUNTER — Ambulatory Visit (INDEPENDENT_AMBULATORY_CARE_PROVIDER_SITE_OTHER): Payer: MEDICAID | Admitting: Pediatrics

## 2023-06-19 ENCOUNTER — Encounter (INDEPENDENT_AMBULATORY_CARE_PROVIDER_SITE_OTHER): Payer: Self-pay | Admitting: Pediatrics

## 2023-06-19 VITALS — BP 130/82 | HR 120 | Wt 223.0 lb

## 2023-06-19 DIAGNOSIS — G40309 Generalized idiopathic epilepsy and epileptic syndromes, not intractable, without status epilepticus: Secondary | ICD-10-CM

## 2023-06-19 DIAGNOSIS — G251 Drug-induced tremor: Secondary | ICD-10-CM | POA: Diagnosis not present

## 2023-06-19 DIAGNOSIS — G4700 Insomnia, unspecified: Secondary | ICD-10-CM | POA: Diagnosis not present

## 2023-06-19 DIAGNOSIS — F840 Autistic disorder: Secondary | ICD-10-CM

## 2023-06-19 DIAGNOSIS — R2689 Other abnormalities of gait and mobility: Secondary | ICD-10-CM

## 2023-06-19 MED ORDER — DIVALPROEX SODIUM 125 MG PO CSDR: DELAYED_RELEASE_CAPSULE | ORAL | 2 refills | Status: DC

## 2023-06-19 NOTE — Progress Notes (Signed)
Patient: Andre Holland MRN: [id number] Sex: male DOB: November 13, 2001  Provider: Lorenz Coaster, MD Location of Care: [facility] Child [facility]rology  Note type: Routine follow-up  History of Present Illness:  Andre Holland is a 22 y.o. male with history of seizures, autism, insomnia, tremor and tow walking who I am seeing for routine follow-up. Patient was last seen on 03/20/23 where I continue Depakote and referred to orthopedics.  Since the last appointment, mother has established with Andre Holland in integrated behavioral health.   Patient presents today with mother who reports the following.     No seizures.  Taking Depakote well.   Give melatonin 9/9:30, goes to be at 10:30.  Asleepo before midnight.  Not waking except to go to the bathroom. Getting up in the morning better.  No changes to other medications.    Saw Andre Holland, who had helpful resources.  Talked to [facility] case Production designer, theatre/television/film.  Mother interested in a day program.She provided a list and left it up to mom.   Mom is interested in Autism Society and [employer]. Andre Holland referred to Autism society, but reported they didn't have formal autism evaluation.  This was at age 89, with a Warden/ranger.  Last evaluated by school in 2016 and 2023.  Director- [email or web address]  Andre Holland.  Referral received 7/19.   Orthopedics- Dr Beryl Meager at [facility] on Wednesday.    Dentistry-everything went well.    PCP doesn't work for [facility] with adults.  Assigned a new doctor with trilliuam with Management consultant medicine.    Past Medical History Past Medical History:  Diagnosis Date   Autism    Seizures [facility])     Surgical History History reviewed. No pertinent surgical history.  Family History family history is not on file.   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    Andre Holland has Tree surgeon.   They are on a waiting  list for [employer] waiver. Has been working with Andre Holland to receive services through the 19-15I plan.   He lives with his mom. He has no siblings.   He enjoys music and watching movies.    Allergies No Known Allergies  Medications Current Outpatient Medications on File Prior to Visit  Medication Sig Dispense Refill   citalopram (CELEXA) 40 MG tablet Take by mouth.     guanFACINE (TENEX) 2 MG tablet Take by mouth.     Melatonin 3 MG CAPS 1 tablet nightly, 1-2 hours before bedtime.  May increase up to 12mg  nightly. 30 capsule 0   Multiple Vitamin (MULTIVITAMIN ADULT PO) Take by mouth.     traZODone (DESYREL) 100 MG tablet TAKE 1/2 TO 1 TABLET BY MOUTH AT BEDTIME FOR SLEEP     fluticasone (FLONASE) 50 MCG/ACT nasal spray Place 1 spray into both nostrils daily. (Patient not taking: Reported on 06/19/2023)     Midazolam (NAYZILAM) 5 MG/0.1ML SOLN Place 5 mg into the nose as needed (for seizure longer than 3 minutes). 2 each 3   No current facility-administered medications on file prior to visit.   The medication list was reviewed and reconciled. All changes or newly prescribed medications were explained.  A complete medication list was provided to the patient/caregiver.  Physical Exam BP 130/82 (BP Location: Left Arm, Patient Position: Sitting, Cuff Size: Small)   Pulse (!) 120 Comment: Taken twice  Wt 223 lb (101.2 kg)  BMI 27.87 kg/m  Facility age limit for growth %iles is 20 years.  No results found. General: NAD, well nourished adult with poor eye contact and engagement.  Paces during discussion.  HEENT: normocephalic, no eye or nose discharge.  MMM  Cardiovascular: warm and well perfused Lungs: Normal work of breathing, no rhonchi or stridor Skin: No birthmarks, no skin breakdown Abdomen: soft, non tender, non distended Extremities: No contractures or edema. Neuro: EOM intact, face symmetric. Moves all extremities equally and at least antigravity. No abnormal movements.+toe  walking.     Diagnosis: 1. Epilepsy, generalized, convulsive (HCC)   2. Drug-induced tremor   3. Autism spectrum disorder with accompanying language impairment and intellectual disability, requiring substantial support   4. Insomnia, unspecified type   5. Toe-walking, habitual      Assessment and Plan [name] is a 22 y.o. male with history of seizures, autism, insomnia, tremor and tow walking who I am seeing for routine follow-up.who I am seeing in follow-up. Patient overall doing well with current ASM regimen.  Reviewed labs from the spring with no concerns. Most of discussion was focused on resource management in finding Andre Holland a day program, but mother has it fairly under control.  We will contact autism society to see how we can assist.   Continue Depakote at current dose  We will contact Autism Society about day program  I spent 30 minutes on day of service on this patient including review of chart, discussion with patient and family, discussion of screening results, coordination with other providers and management of orders and paperwork.     Return in about 3 months (around 09/19/2023).  Lorenz Coaster MD MPH Neurology and Neurodevelopment [facility]  [address] [place], [place], [place] [address] Phone: [phone] Fax: [phone]

## 2023-06-19 NOTE — Patient Instructions (Signed)
Continue Depakote at current dose  We will contact Autism Society about day program

## 2023-06-20 ENCOUNTER — Telehealth (INDEPENDENT_AMBULATORY_CARE_PROVIDER_SITE_OTHER): Payer: Self-pay | Admitting: Pediatrics

## 2023-06-20 NOTE — Telephone Encounter (Signed)
Called the Autism Society to inquire about Andre Holland's status about starting there. There were some issues with diagnosis documentation, as reported by [name]'s mother in the visit on 7/22. I left a message and the [employer] will call me back in 3-5 business days.

## 2023-06-25 ENCOUNTER — Encounter (INDEPENDENT_AMBULATORY_CARE_PROVIDER_SITE_OTHER): Payer: Self-pay | Admitting: Pediatrics

## 2023-06-25 DIAGNOSIS — R2689 Other abnormalities of gait and mobility: Secondary | ICD-10-CM | POA: Insufficient documentation

## 2023-06-27 NOTE — Telephone Encounter (Signed)
Heard back from Autism Society. They said they had made a decision regarding Andre Holland but requested that I reach out to mom about said decision.

## 2023-06-27 NOTE — Telephone Encounter (Signed)
Called mom and left a voicemail asking if she needed anything from our office regarding documentation for Bernd's diagnosis since Autism Society had not provided any information about that.

## 2023-07-07 ENCOUNTER — Encounter (INDEPENDENT_AMBULATORY_CARE_PROVIDER_SITE_OTHER): Payer: Self-pay | Admitting: Pediatrics

## 2023-10-10 ENCOUNTER — Encounter (INDEPENDENT_AMBULATORY_CARE_PROVIDER_SITE_OTHER): Payer: Self-pay

## 2023-11-30 NOTE — Progress Notes (Signed)
 Patient: Andre Andre MRN: [id number] Sex: male DOB: 2001-07-21  Provider: Corean Geralds, MD Location of Care: [facility]  Note type: Routine follow-up  History of Present Illness:  Andre Andre is a 23 y.o. male with history of seizures, autism, insomnia, tremor and toe walking who I am seeing for routine follow-up. Patient was last seen on 06/19/2023 where I continued Depakote  and planned on reaching out to Autism Society about day program.  Since the last appointment, patient saw Dr. Tobie with [facility] on 06/21/2023 for toe walking and they were considering AFOs and botox and casting.   Patient presents today with mother.     No seizures, taking medication well.    Recently saw psychiatry who continued medications.  He has had a few meltdowns but mom feels he is bored.  SLeeping well.    He has been home with mom, he will go into the community with her.  Behavior   Starting with 1:1 services with Andre Andre.  He was approved for the Andre Andre waiver.  Need new evaluation, gave names for approved people.  Mom is interested in him going back to day program when possible, interested in going back to autism society but didn't have availability when they last talked, told he could reapply over the summer.    With toe walking, mom considering botox. Has an appontment in February.   Past Medical History Past Medical History:  Diagnosis Date   Autism    Seizures Andre Andre)     Surgical History History reviewed. No pertinent surgical history.  Family History family history is not on file.   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    Andre Andre has Tree Surgeon.   They are on a waiting list for Andre Andre waiver. Has been working with Andre Andre.   He lives with his mom. He has no siblings.    He enjoys music and watching movies.    Allergies No Known Allergies  Medications Current Outpatient Medications on File Prior to Visit  Medication Sig Dispense Refill   citalopram (CELEXA) 40 MG tablet Take by mouth.     guanFACINE (TENEX) 2 MG tablet Take by mouth.     Melatonin 3 MG CAPS 1 tablet nightly, 1-2 hours before bedtime.  May increase up to 12mg  nightly. 30 capsule 0   Multiple Vitamin (MULTIVITAMIN ADULT PO) Take by mouth.     traZODone (DESYREL) 100 MG tablet TAKE 1/2 TO 1 TABLET BY MOUTH AT BEDTIME FOR SLEEP     fluticasone (FLONASE) 50 MCG/ACT nasal spray Place 1 spray into both nostrils daily. (Patient not taking: Reported on 12/07/2023)     No current facility-administered medications on file prior to visit.   The medication list was reviewed and reconciled. All changes or newly prescribed medications were explained.  A complete medication list was provided to the patient/caregiver.  Physical Exam Wt 203 lb 12.8 oz (92.4 kg)   BMI 25.47 kg/m  Facility age limit for growth %iles is 20 years.  No results found. Gen: well appearing adult  Skin: No rash, No neurocutaneous stigmata. HEENT: Normocephalic, no dysmorphic features, no conjunctival injection, nares patent, mucous membranes moist, oropharynx clear. Neck: Supple, no meningismus. No focal tenderness. Resp: Clear to auscultation bilaterally CV: Regular rate, normal S1/S2, no murmurs, no rubs Abd:  BS present, abdomen soft, non-tender, non-distended. No hepatosplenomegaly or mass Ext: Warm and well-perfused. No deformities, no muscle wasting, ROM full.  Neurological Examination: MS: Awake, alert, interactive. Poor eye contact, answers pointed questions with 1 word answers, speech was fluent.  Poor attention in room. Cranial Nerves: Pupils were equal and reactive to light;  EOM normal, no nystagmus; no ptsosis, no double vision, intact facial sensation, face symmetric with full strength of facial muscles,  hearing intact grossly.  Motor-Normal tone throughout, contracture in bilateral ankles L>R. Normal strength in all muscle groups. No abnormal movements Reflexes- Reflexes 2+ and symmetric in the biceps, triceps, patellar and achilles tendon. Plantar responses flexor bilaterally, no clonus noted Sensation: Intact to light touch throughout.   Coordination: No dysmetria with reaching for objects Gait: Toe-walking, worse on left.     Diagnosis: 1. Epilepsy, generalized, convulsive (HCC)   2. Drug-induced tremor   3. Autism spectrum disorder with accompanying language impairment and intellectual disability, requiring substantial support   4. Toe-walking, habitual   5. Long-term current use of anticonvulsant      Assessment and Andre Andre is a 23 y.o. male with history of  seizures, autism, insomnia, tremor and toe walking who I am seeing in follow-up. Patient overall stable. Discussed toe-wlaking and options for management.    Refilled Depakote , Nayzilam  Ordered labs for medication management Referred for PT. I recommend starting PT before doing botox and continuing once botox is completed.  Recommend confirming your upcoming appointment with Dr. Tobie  I spent 25 minutes on day of service on this patient including review of chart, discussion with patient and family, discussion of screening results, coordination with other providers and management of orders and paperwork.     Return in about 6 months (around 06/05/2024).  Corean Geralds MD MPH Neurology and Neurodevelopment [facility]  [address] [place], [place], [place] [address] Phone: [phone] Fax: [phone]

## 2023-12-07 ENCOUNTER — Encounter (INDEPENDENT_AMBULATORY_CARE_PROVIDER_SITE_OTHER): Payer: Self-pay | Admitting: Pediatrics

## 2023-12-07 ENCOUNTER — Ambulatory Visit (INDEPENDENT_AMBULATORY_CARE_PROVIDER_SITE_OTHER): Payer: MEDICAID | Admitting: Pediatrics

## 2023-12-07 VITALS — Wt 203.8 lb

## 2023-12-07 DIAGNOSIS — R2689 Other abnormalities of gait and mobility: Secondary | ICD-10-CM

## 2023-12-07 DIAGNOSIS — G251 Drug-induced tremor: Secondary | ICD-10-CM | POA: Diagnosis not present

## 2023-12-07 DIAGNOSIS — Z79899 Other long term (current) drug therapy: Secondary | ICD-10-CM

## 2023-12-07 DIAGNOSIS — G40309 Generalized idiopathic epilepsy and epileptic syndromes, not intractable, without status epilepticus: Secondary | ICD-10-CM

## 2023-12-07 DIAGNOSIS — F840 Autistic disorder: Secondary | ICD-10-CM

## 2023-12-07 DIAGNOSIS — F79 Unspecified intellectual disabilities: Secondary | ICD-10-CM

## 2023-12-07 MED ORDER — DIVALPROEX SODIUM 125 MG PO CSDR: DELAYED_RELEASE_CAPSULE | ORAL | 2 refills | Status: DC

## 2023-12-07 MED ORDER — NAYZILAM 5 MG/0.1ML NA SOLN: 5.0000 mg | NASAL | 3 refills | Status: DC | PRN

## 2023-12-07 NOTE — Patient Instructions (Signed)
 Refilled Depakote , Nayzilam  Ordered labs. You can get these whenever is convenient for you. You can come back to our office on Thursdays to get these done or go to another lab. Please do this in the morning before he has had his meds.  Referred for PT. I recommend starting PT before doing botox and continuing once botox is completed.  Recommend confirming your upcoming appointment with Dr. Tobie

## 2023-12-11 ENCOUNTER — Encounter (INDEPENDENT_AMBULATORY_CARE_PROVIDER_SITE_OTHER): Payer: Self-pay | Admitting: Pediatrics

## 2023-12-27 ENCOUNTER — Other Ambulatory Visit (INDEPENDENT_AMBULATORY_CARE_PROVIDER_SITE_OTHER): Payer: Self-pay | Admitting: Pediatrics

## 2024-02-06 ENCOUNTER — Ambulatory Visit: Payer: MEDICAID | Attending: Pediatrics | Admitting: Physical Therapy

## 2024-02-06 ENCOUNTER — Encounter: Payer: Self-pay | Admitting: Physical Therapy

## 2024-02-06 ENCOUNTER — Other Ambulatory Visit: Payer: Self-pay

## 2024-02-06 DIAGNOSIS — R2681 Unsteadiness on feet: Secondary | ICD-10-CM | POA: Diagnosis present

## 2024-02-06 DIAGNOSIS — M25672 Stiffness of left ankle, not elsewhere classified: Secondary | ICD-10-CM | POA: Insufficient documentation

## 2024-02-06 DIAGNOSIS — M25671 Stiffness of right ankle, not elsewhere classified: Secondary | ICD-10-CM | POA: Insufficient documentation

## 2024-02-06 DIAGNOSIS — R2689 Other abnormalities of gait and mobility: Secondary | ICD-10-CM | POA: Diagnosis present

## 2024-02-06 NOTE — Therapy (Signed)
 OUTPATIENT PHYSICAL THERAPY NEURO EVALUATION   Patient Name: Andre Holland MRN: [id number] DOB:[birth year], 23 y.o., male Today's Date: 02/06/2024  PCP: Darrow Bussing, MD REFERRING PROVIDER: Margurite Auerbach, MD   PT End of Session - 02/06/24 1100     Visit Number 1    Number of Visits 6   scheduled out due to hold until start botox   Date for PT Re-Evaluation 04/02/24    Authorization Type [employer]    PT Start Time 1100    PT Stop Time 1127    PT Time Calculation (min) 27 min    Equipment Utilized During Treatment Gait belt    Activity Tolerance Patient tolerated treatment well    Behavior During Therapy Restless             Past Medical History:  Diagnosis Date   Autism    Seizures (HCC)    History reviewed. No pertinent surgical history. Patient Active Problem List   Diagnosis Date Noted   Toe-walking, habitual 06/25/2023   Insomnia, unspecified 09/16/2020   Drug-induced tremor 09/16/2020   Abnormal involuntary movements 03/11/2020   Non-refractory typical absence seizures (HCC) 03/13/2018   Epilepsy, generalized, convulsive (HCC) 09/20/2017   Autism spectrum disorder with accompanying language impairment and intellectual disability, requiring substantial support 09/20/2017    ONSET DATE: 12/07/2023 (referral date)  REFERRING DIAG: R26.89 (ICD-10-CM) - Toe-walking, habitual   THERAPY DIAG:  Stiffness of left ankle, not elsewhere classified - Plan: PT plan of care cert/re-cert  Stiffness of right ankle, not elsewhere classified - Plan: PT plan of care cert/re-cert  Other abnormalities of gait and mobility - Plan: PT plan of care cert/re-cert  Unsteadiness on feet - Plan: PT plan of care cert/re-cert  Rationale for Evaluation and Treatment Rehabilitation  SUBJECTIVE:                                                                                                                                                                                               SUBJECTIVE STATEMENT: Patient goes by "Cam." Patient nonverbal so mother provides subjective.  Patient is following up with Dr. Artis Flock for management of toe walking. Referred to Dr. Allena Katz for botox. Patient has not had this follow up yet but they are planning one hopefully over the next week. Patient mother with a few questions about what to expect. Cam likes heavy metal music, walks, and being outside.   Pt accompanied by: family member (mother "Georgiann Hahn" Natalia Leatherwood)  PERTINENT HISTORY: seizures, autism, insomnia, and tremor  (last seizure was on October of last year - initially absent but progressed to full  body but are better controlled now)   PAIN:  Are you having pain?  Pt is nonverbal but per mothers report doesn't seem to be in pain when up moving.   PRECAUTIONS: Fall, seizures, when patient becomes overwhelmed mother recommends patient lay down  WEIGHT BEARING RESTRICTIONS No  FALLS: Has patient fallen in last 6 months? No  LIVING ENVIRONMENT: Lives with: lives with their family Lives in: House/apartment Stairs: Yes: Internal: 16 steps; on right going up and External: 2 steps; none Also has another set up stairs going into basement, one rail Has following equipment at home: None  PLOF: Needs assistance with ADLs, has shower stool in shower to sit on for safety.    PATIENT GOALS More flexibility in feet and more stable on feet.   OBJECTIVE:   DIAGNOSTIC FINDINGS:  Per mother - x-ray of feet was WNL  COGNITION: Overall cognitive status: History of cognitive impairments - at baseline   SENSATION: Light touch: WFL  COORDINATION: WFL for functional testing noted in today's eval and prior testing   POSTURE: rounded shoulders, forward head, increased thoracic kyphosis, and flexed trunk   LOWER EXTREMITY ROM:     Passive  Right Eval Left Eval  Hip flexion    Hip extension    Hip abduction    Hip adduction    Hip internal rotation    Hip external  rotation    Knee flexion    Knee extension    Ankle dorsiflexion -35 -30  Ankle plantarflexion    Ankle inversion    Ankle eversion     (Blank rows = not tested) Tested in seated position (Right and left in longsit with mother assisting into ROM, limited by tolerance for testing)  LOWER EXTREMITY MMT:    Not tested due to sensory processing  TRANSFERS: Assistive device utilized: None  Sit to stand: SBA Stand to sit: SBA Chair to chair: SBA Floor: SBA   GAIT: Gait pattern: step to pattern, step through pattern, decreased arm swing- Right, decreased arm swing- Left, decreased stride length, decreased ankle dorsiflexion- Right, decreased ankle dorsiflexion- Left, and trunk flexed, ambulates in PF on both sides with internal rotation at hips Distance walked: various clinic distances Assistive device utilized:  mother intermittently helping stabilize to direct paitent Level of assistance: SBA and CGA Comments: toe walking gait pattern, patient fast moving and efficient, mild unsteadiness due to anterior shift of body weight  PATIENT EDUCATION: Education details: Educated on evaluation results, POC, goals, limitations Person educated: Patient and Parent Education method: Programmer, multimedia, Facilities manager, and Verbal cues Education comprehension: verbalized understanding, verbal cues required, and needs further education  HOME EXERCISE PROGRAM: Continue HEP from last bought of care   GOALS: Goals reviewed with patient? Yes  SHORT TERM GOALS: Target date: 03/05/2024   Pt/mother will have initial HEP in order to indicate improved functional mobility and dec fall risk. Baseline: To be initiated  Goal status: INITIAL  2.  Pt will demonstrate 10 deg improvement in B ankle DF ROM in order to indicate improved balance.   Baseline: -35 R, -30 L Goal status: INITIAL   LONG TERM GOALS: Target date: 04/02/2024  Pt/mother will be IND with final HEP in order to indicate improved functional  mobility and dec fall risk.  Baseline: Depedent  Goal status: INITIAL  2.  Pt will improve B ankle DF ROM by 10 deg from baseline in order to indicate improved function and balance.  Baseline: -35 R, -30 L  Goal status:  INITIAL   ASSESSMENT:  CLINICAL IMPRESSION: Patient is a 23 y.o. male who was seen today for physical therapy evaluation and treatment for toe walking; patient known to this clinic but now following up with medical team about starting botox.  Note history of seizures, autism, insomnia, and tremor.Pt is nonverbal and restless throughout session but responds well to mother's cues. Patient ROM presentation consistent what seen in prior evaluation, demonstrating extensive ROM deficits and contractures in bilateral ankles. Recommend initiating botox prior to starting PT as no ROM gains were made from prior bout of PT. Patient will benefit from skilled OP neuro in order to address deficits upon initiation of botox.   OBJECTIVE IMPAIRMENTS Abnormal gait, decreased balance, decreased cognition, decreased endurance, decreased mobility, decreased ROM, and impaired flexibility.   ACTIVITY LIMITATIONS standing, stairs, and locomotion level  PARTICIPATION LIMITATIONS: community activity  PERSONAL FACTORS Behavior pattern, Time since onset of injury/illness/exacerbation, and 1-2 comorbidities: see above  are also affecting patient's functional outcome.   REHAB POTENTIAL: Fair due to cognitive/behavioral deficits  CLINICAL DECISION MAKING: Evolving/moderate complexity  EVALUATION COMPLEXITY: Moderate  PLAN: PT FREQUENCY: on hold until initiate botox and the start at 2x/week   PT DURATION: 8 weeks 04/02/2024  PLANNED INTERVENTIONS: Therapeutic exercises, Therapeutic activity, Neuromuscular re-education, Balance training, Gait training, Patient/Family education, Self Care, Joint mobilization, Stair training, Orthotic/Fit training, Aquatic Therapy, and Manual therapy  PLAN FOR NEXT  SESSION: re-cert as needed following botox, PRAFO?, work on ROM of ankle stretches family can perform at home, may benefit from quiet treatment room at first     Maryruth Eve, PT, DPT 02/06/24, 11:53 AM  For all possible CPT codes, reference the Planned Interventions line above.     Check all conditions that are expected to impact treatment: {Conditions expected to impact treatment:Cognitive Impairment or Intellectual disability, Musculoskeletal disorders, Contractures, spasticity or fracture relevant to requested treatment, Structural or anatomic abnormalities, and Neurological condition and/or seizures   If treatment provided at initial evaluation, no treatment charged due to lack of authorization.

## 2024-05-29 NOTE — Progress Notes (Signed)
 Patient: Andre Holland MRN: [id number] Sex: male DOB: Feb 01, 2001  Provider: Corean Geralds, MD Location of Care: [facility]  Note type: Routine follow-up  History of Present Illness:  Andre Holland is a 23 y.o. male with history of seizures, autism, insomnia, tremor and toe walking who I am seeing for routine follow-up. Patient was last seen on 12/07/2023 where I refilled Depakote  and Nayzilam , ordered labs, and referred to PT.  Since the last appointment, patient had a PT evaluation on 02/06/2024 where they planned for therapy twice a week after botox.   Patient presents today with mother who reports the following:    Approved for [employer] waiver effective 7/1. He had been getting 30 hours with [employer] and now approved for 84 hours through [employer]. Mom is interested in a day program now that he has [employer] so that he can socialize. She would like through the Autism society but they do not have openings right now.   No seizures. He has been taking all medications with no issues.  Sleeping more consistently. Mood and behavior have generally been good but some more irritability. This has increased slightly but Dr. Akintayo has added hydroxyzine as needed, which has been effective.   He has not seen Dr. Tobie. Mom has read more about botox, and she is interested. She is wondering about him sitting still for the injections. She is also wondering about his balance after injections, Dr. Tobie has mentioned casting but mom worried about sensory input.   Patient History:  Andre Holland sees Dr Akintayo for mood, sleep and behavior.   Past Medical History Past Medical History:  Diagnosis Date   Autism    Seizures [facility])     Surgical History History reviewed. No pertinent surgical history.  Family History family history is not on file.   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    He attended  [employer].    Andre Holland has Tree surgeon.   They are on a waiting list for [employer] waiver. Has been working with [facility] to receive services through the 19-15I plan.   He lives with his mom. He has no siblings.   He enjoys music and watching movies.    Allergies No Known Allergies  Medications Current Outpatient Medications on File Prior to Visit  Medication Sig Dispense Refill   citalopram (CELEXA) 40 MG tablet Take by mouth.     divalproex  (DEPAKOTE  SPRINKLE) 125 MG capsule TAKE 4 (500MG ) CAPSULES IN MORNING, 8 CAPSULES (1,000MG ) AT NIGHT 1980 capsule 5   fluticasone (FLONASE) 50 MCG/ACT nasal spray Place 1 spray into both nostrils daily.     guanFACINE (TENEX) 2 MG tablet Take by mouth.     hydrOXYzine (ATARAX) 25 MG tablet Take 25 mg by mouth daily as needed.     Melatonin 3 MG CAPS 1 tablet nightly, 1-2 hours before bedtime.  May increase up to 12mg  nightly. 30 capsule 0   Multiple Vitamin (MULTIVITAMIN ADULT PO) Take by mouth.     traZODone (DESYREL) 100 MG tablet TAKE 1/2 TO 1 TABLET BY MOUTH AT BEDTIME FOR SLEEP     Midazolam  (NAYZILAM ) 5 MG/0.1ML SOLN Place 5 mg into the nose as needed (for seizure longer than 3 minutes). 2 each 3   No current facility-administered medications on file prior to visit.   The medication list was reviewed and reconciled. All changes or newly prescribed medications  were explained.  A complete medication list was provided to the patient/caregiver.  Physical Exam BP 132/80 (BP Location: Left Arm, Patient Position: Sitting, Cuff Size: Large)   Ht 6' 3 (1.905 m)   Wt 208 lb (94.3 kg)   BMI 26.00 kg/m  Facility age limit for growth %iles is 20 years.  No results found. Gen: well appearing young man Skin: No rash, No neurocutaneous stigmata. HEENT: Normocephalic, no dysmorphic features, no conjunctival injection, nares patent, mucous membranes moist, oropharynx clear. Neck: Supple, no meningismus. No focal  tenderness. Resp: Clear to auscultation bilaterally CV: Regular rate, normal S1/S2, no murmurs, no rubs Abd: BS present, abdomen soft, non-tender, non-distended. No hepatosplenomegaly or mass Ext: Warm and well-perfused. No deformities, no muscle wasting, ROM full. Contracture in bilateral ankles.   Neurological Examination: MS: Awake, alert, interactive. Poor eye contact, answers pointed questions with 1 word answers, speech was fluent.   Cranial Nerves: Pupils were equal and reactive to light;  EOM normal, no nystagmus; no ptsosis, no double vision, intact facial sensation, face symmetric with full strength of facial muscles, hearing intact grossly.  Motor-Normal tone throughout, Normal strength in all muscle groups. No abnormal movements Reflexes- Reflexes 2+ and symmetric in the biceps, triceps, patellar and achilles tendon. Plantar responses flexor bilaterally, no clonus noted Sensation: Intact to light touch throughout.   Coordination: No dysmetria with reaching for objects Gait: +toe-walking    Diagnosis: 1. High risk medication use   2. Epilepsy, generalized, convulsive (HCC)      Assessment and Plan Andre Holland is a 23 y.o. male with history of seizures, autism, insomnia, tremor and toe walking who I am seeing in follow-up. Patient is doing well. Seizures are well controlled on current dose of medication so refilled today. Patient continuing to toe walk so recommend seeing Dr. Tobie for botox. I also recommend starting PT once botox is scheduled as this will help him regain his balance and work to loosen his tendon.   Ordered labs to be drawn today for medicaiton monitoring Continue Divalproex  500 mg in the morning and 1000 mg at night.  Recommend seeing Dr. Tobie to discuss botox  I spent 30 minutes on day of service on this patient including review of chart, discussion with patient and family, discussion of screening results, coordination with other providers and  management of orders and paperwork. This time does not include does include any behavioral screenings, baclofen pump refills, or VNS interrogations.  Return in about 6 months (around 12/07/2024).  I, Earnie Brandy, scribed for and in the presence of Corean Geralds, MD at today's visit on 06/06/2024.  I, Corean Geralds MD MPH, personally performed the services described in this documentation, as scribed by Earnie Brandy in my presence on 06/06/2024 and it is accurate, complete, and reviewed by me.     Corean Geralds MD MPH Neurology and Neurodevelopment [facility]  [address] [place], [place], [place] [address] Phone: [phone] Fax: [phone]

## 2024-06-06 ENCOUNTER — Ambulatory Visit (INDEPENDENT_AMBULATORY_CARE_PROVIDER_SITE_OTHER): Payer: MEDICAID | Admitting: Pediatrics

## 2024-06-06 ENCOUNTER — Encounter (INDEPENDENT_AMBULATORY_CARE_PROVIDER_SITE_OTHER): Payer: Self-pay | Admitting: Pediatrics

## 2024-06-06 VITALS — BP 132/80 | Ht 75.0 in | Wt 208.0 lb

## 2024-06-06 DIAGNOSIS — G4700 Insomnia, unspecified: Secondary | ICD-10-CM

## 2024-06-06 DIAGNOSIS — Z79899 Other long term (current) drug therapy: Secondary | ICD-10-CM | POA: Diagnosis not present

## 2024-06-06 DIAGNOSIS — G40309 Generalized idiopathic epilepsy and epileptic syndromes, not intractable, without status epilepticus: Secondary | ICD-10-CM

## 2024-06-06 DIAGNOSIS — F840 Autistic disorder: Secondary | ICD-10-CM | POA: Diagnosis not present

## 2024-06-06 NOTE — Patient Instructions (Addendum)
 Ordered labs to be drawn today. I will call you with the results Continue medications Recommend seeing Dr. Tobie to discuss botox

## 2024-06-07 ENCOUNTER — Ambulatory Visit (INDEPENDENT_AMBULATORY_CARE_PROVIDER_SITE_OTHER): Payer: Self-pay | Admitting: Pediatrics

## 2024-06-07 LAB — COMPREHENSIVE METABOLIC PANEL WITH GFR
AG Ratio: 1.9 (calc) (ref 1.0–2.5)
ALT: 21 U/L (ref 9–46)
AST: 18 U/L (ref 10–40)
Albumin: 4.6 g/dL (ref 3.6–5.1)
Alkaline phosphatase (APISO): 75 U/L (ref 36–130)
BUN: 11 mg/dL (ref 7–25)
CO2: 25 mmol/L (ref 20–32)
Calcium: 9.8 mg/dL (ref 8.6–10.3)
Chloride: 105 mmol/L (ref 98–110)
Creat: 1.05 mg/dL (ref 0.60–1.24)
Globulin: 2.4 g/dL (ref 1.9–3.7)
Glucose, Bld: 80 mg/dL (ref 65–99)
Potassium: 4.4 mmol/L (ref 3.5–5.3)
Sodium: 138 mmol/L (ref 135–146)
Total Bilirubin: 0.5 mg/dL (ref 0.2–1.2)
Total Protein: 7 g/dL (ref 6.1–8.1)
eGFR: 102 mL/min/1.73m2 (ref >=60)

## 2024-06-07 LAB — HEMOGLOBIN A1C
Hgb A1c MFr Bld: 5 % (ref <5.7)
Mean Plasma Glucose: 97 mg/dL
eAG (mmol/L): 5.4 mmol/L

## 2024-06-07 LAB — CBC WITH DIFFERENTIAL/PLATELET
Absolute Lymphocytes: 1459 {cells}/uL (ref 850–3900)
Absolute Monocytes: 379 {cells}/uL (ref 200–950)
Basophils Absolute: 19 {cells}/uL (ref 0–200)
Basophils Relative: 0.4 %
Eosinophils Absolute: 29 {cells}/uL (ref 15–500)
Eosinophils Relative: 0.6 %
HCT: 47.7 % (ref 38.5–50.0)
Hemoglobin: 15.8 g/dL (ref 13.2–17.1)
MCH: 30.4 pg (ref 27.0–33.0)
MCHC: 33.1 g/dL (ref 32.0–36.0)
MCV: 91.9 fL (ref 80.0–100.0)
MPV: 11.6 fL (ref 7.5–12.5)
Monocytes Relative: 7.9 %
Neutro Abs: 2914 {cells}/uL (ref 1500–7800)
Neutrophils Relative %: 60.7 %
Platelets: 182 Thousand/uL (ref 140–400)
RBC: 5.19 Million/uL (ref 4.20–5.80)
RDW: 13 % (ref 11.0–15.0)
Total Lymphocyte: 30.4 %
WBC: 4.8 Thousand/uL (ref 3.8–10.8)

## 2024-06-07 LAB — THYROID PANEL WITH TSH
Free Thyroxine Index: 2.8 (ref 1.4–3.8)
T3 Uptake: 32 % (ref 22–35)
T4, Total: 8.7 ug/dL (ref 4.9–10.5)
TSH: 2.93 m[IU]/L (ref 0.40–4.50)

## 2024-06-07 LAB — LIPID PANEL
Cholesterol: 189 mg/dL (ref <200)
HDL: 37 mg/dL — ABNORMAL LOW (ref >=40)
LDL Cholesterol (Calc): 123 mg/dL — ABNORMAL HIGH
Non-HDL Cholesterol (Calc): 152 mg/dL — ABNORMAL HIGH (ref <130)
Total CHOL/HDL Ratio: 5.1 (calc) — ABNORMAL HIGH (ref <5.0)
Triglycerides: 171 mg/dL — ABNORMAL HIGH (ref <150)

## 2024-06-07 LAB — VALPROIC ACID LEVEL: Valproic Acid Lvl: 69.6 mg/L (ref 50.0–100.0)

## 2024-06-07 LAB — LIPASE: Lipase: 8 U/L (ref 7–60)

## 2024-06-17 ENCOUNTER — Encounter (INDEPENDENT_AMBULATORY_CARE_PROVIDER_SITE_OTHER): Payer: Self-pay | Admitting: Pediatrics

## 2024-12-04 NOTE — Progress Notes (Incomplete)
 "  Patient: Andre Holland MRN: [id number] Sex: male DOB: 18-Dec-2000  Provider: Corean Geralds, MD Location of Care: [facility]  Note type: Routine follow-up  History of Present Illness:  Andre Holland is a 24 y.o. male with history of seizures, autism, insomnia, tremor and toe walking who I am seeing for routine follow-up. Patient was last seen on 06/06/2024 where I ordered labs, continued Divalproex , and recommended seeing Dr. Tobie to discuss botox.  Since the last appointment, there are no appointments in the chart.   Patient presents today with {CHL AMB PARENT/GUARDIAN:[id number]} who reports the following:    Things are going well, no seizures. He is sleeping well. He gets Melatonin at 9:30 and he goes to sleep at 10:30/11 pm.   He has previously gone five years without seizure and tried to taper medication. He then had a tonic clonic seizure. Previously, he had absence seizures. Mom interested in trying to wean medication again or reducing it. He did well with his previous EEG at the hospital. They still have Nayzilam  at home.  He is doing well behaviorally generally. He has hydroxyzine to help with behavior, mom had to use it yesterday and it was effective.   They talked about the high cholesterol from his labs with his PCP. Working on eating healthier.  He is getting 1 on 1 services through [place] with the [employer]. Mom would like for him to get more structure, so she has toured the [employer] day program. He is planning to start there with mom being his 1 on 1 during the transition, probably around March. They will update his IEP this week. Hoping to keep his current one on one after day program.   Planning to schedule with Dr. Tobie for toe walking and with Dr. Jeannine for dentistry. They have been working on the stretches PT gave them for toe walking and getting more comfortable shoes.   Screenings:  Patient History:  [place]  sees Dr Akintayo for mood, sleep and behavior.   Diagnosed with Autism when he was 2. Has had PT, OT, ST, and ABA therapy. He is at [employer], in his last year, working on transition planning, he is also getting ST and OT right now. Plan after this is unclear. He is now on the [employer] waiver. Mom has guardianship.  Seizure History: Last seizure: 06/26/2021  Semiology: [facility]   Past Medical History Past Medical History:  Diagnosis Date   Autism    Seizures [facility])     Surgical History No past surgical history on file.  Family History family history is not on file.   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    Linkyn has Tree Surgeon.   They are on a waiting list for [employer] waiver. Has been working with [facility] to receive services through the 19-15I plan.   He lives with his mom. He has no siblings.   He enjoys music and watching movies.    Allergies Allergies[1]  Medications Medications Ordered Prior to Encounter[2] The medication list was reviewed and reconciled. All changes or newly prescribed medications were explained.  A complete medication list was provided to the patient/caregiver.  Physical Exam There were no vitals taken for this visit. Facility age limit for growth %iles is 20 years.  No results found.  ***   Diagnosis:No diagnosis found.  Assessment and Plan Andre Holland is a 24 y.o. male with history of seizures, autism, insomnia, tremor and toe walking who I am seeing in follow-up.   I spent *** minutes on day of service on this patient including review of chart, discussion with patient and family, discussion of screening results, coordination with other providers and management of orders and paperwork.     No follow-ups on file.  Corean Geralds MD MPH Neurology and Neurodevelopment [facility]  [address] [place],  [place], [place] [address] Phone: [phone] Fax: [phone]      [1] No Known Allergies [2]  Current Outpatient Medications on File Prior to Visit  Medication Sig Dispense Refill   citalopram (CELEXA) 40 MG tablet Take by mouth.     divalproex  (DEPAKOTE  SPRINKLE) 125 MG capsule TAKE 4 (500MG ) CAPSULES IN MORNING, 8 CAPSULES (1,000MG ) AT NIGHT 1980 capsule 5   fluticasone (FLONASE) 50 MCG/ACT nasal spray Place 1 spray into both nostrils daily.     guanFACINE (TENEX) 2 MG tablet Take by mouth.     hydrOXYzine (ATARAX) 25 MG tablet Take 25 mg by mouth daily as needed.     Melatonin 3 MG CAPS 1 tablet nightly, 1-2 hours before bedtime.  May increase up to 12mg  nightly. 30 capsule 0   Midazolam  (NAYZILAM ) 5 MG/0.1ML SOLN Place 5 mg into the nose as needed (for seizure longer than 3 minutes). 2 each 3   Multiple Vitamin (MULTIVITAMIN ADULT PO) Take by mouth.     traZODone (DESYREL) 100 MG tablet TAKE 1/2 TO 1 TABLET BY MOUTH AT BEDTIME FOR SLEEP     No current facility-administered medications on file prior to visit.   "

## 2024-12-09 ENCOUNTER — Ambulatory Visit (INDEPENDENT_AMBULATORY_CARE_PROVIDER_SITE_OTHER): Payer: MEDICAID | Admitting: Pediatrics

## 2024-12-09 ENCOUNTER — Encounter (INDEPENDENT_AMBULATORY_CARE_PROVIDER_SITE_OTHER): Payer: Self-pay | Admitting: Pediatrics

## 2024-12-09 VITALS — BP 122/80 | Wt 214.0 lb

## 2024-12-09 DIAGNOSIS — G40309 Generalized idiopathic epilepsy and epileptic syndromes, not intractable, without status epilepticus: Secondary | ICD-10-CM

## 2024-12-09 MED ORDER — NAYZILAM 5 MG/0.1ML NA SOLN: 5.0000 mg | NASAL | 3 refills | Status: AC | PRN

## 2024-12-09 MED ORDER — DIVALPROEX SODIUM 125 MG PO CSDR: DELAYED_RELEASE_CAPSULE | ORAL | 0 refills | Status: AC

## 2024-12-09 NOTE — Patient Instructions (Addendum)
 Refilled Divalproex  and Nayzilam  Ordered an EEG at the hospital

## 2024-12-12 ENCOUNTER — Other Ambulatory Visit (INDEPENDENT_AMBULATORY_CARE_PROVIDER_SITE_OTHER): Payer: Self-pay

## 2024-12-12 DIAGNOSIS — G40309 Generalized idiopathic epilepsy and epileptic syndromes, not intractable, without status epilepticus: Secondary | ICD-10-CM

## 2024-12-18 ENCOUNTER — Ambulatory Visit (HOSPITAL_COMMUNITY)
Admission: RE | Admit: 2024-12-18 | Discharge: 2024-12-18 | Disposition: A | Discharge status: Home / Self Care | Payer: MEDICAID | Source: Ambulatory Visit | Attending: Pediatrics | Admitting: Pediatrics

## 2024-12-18 NOTE — Progress Notes (Cosign Needed)
 EEG complete - results pending

## 2024-12-22 ENCOUNTER — Encounter (INDEPENDENT_AMBULATORY_CARE_PROVIDER_SITE_OTHER): Payer: Self-pay | Admitting: Pediatrics

## 2025-03-17 ENCOUNTER — Ambulatory Visit (INDEPENDENT_AMBULATORY_CARE_PROVIDER_SITE_OTHER): Payer: Self-pay | Admitting: Pediatrics
